# Patient Record
Sex: Male | Born: 1985 | Race: White | Hispanic: No | Marital: Single | Smoking: Never smoker
Health system: Southern US, Community
[De-identification: ages and names within clinical notes are randomized; demographics above are authoritative.]

## PROBLEM LIST (undated history)

## (undated) DIAGNOSIS — F32A Depression, unspecified: Secondary | ICD-10-CM

## (undated) DIAGNOSIS — Z789 Other specified health status: Secondary | ICD-10-CM

## (undated) DIAGNOSIS — F419 Anxiety disorder, unspecified: Secondary | ICD-10-CM

## (undated) DIAGNOSIS — T7840XA Allergy, unspecified, initial encounter: Secondary | ICD-10-CM

## (undated) DIAGNOSIS — F640 Transsexualism: Secondary | ICD-10-CM

## (undated) DIAGNOSIS — F329 Major depressive disorder, single episode, unspecified: Secondary | ICD-10-CM

## (undated) HISTORY — DX: Anxiety disorder, unspecified: F41.9

## (undated) HISTORY — DX: Other specified health status: Z78.9

## (undated) HISTORY — DX: Allergy, unspecified, initial encounter: T78.40XA

## (undated) HISTORY — DX: Major depressive disorder, single episode, unspecified: F32.9

## (undated) HISTORY — DX: Transsexualism: F64.0

## (undated) HISTORY — DX: Depression, unspecified: F32.A

---

## 2006-06-01 HISTORY — PX: MASTECTOMY: SHX3

## 2011-06-02 HISTORY — PX: LAPAROSCOPIC TOTAL HYSTERECTOMY: SUR800

## 2011-06-02 HISTORY — PX: APPENDECTOMY: SHX54

## 2012-09-22 ENCOUNTER — Ambulatory Visit (INDEPENDENT_AMBULATORY_CARE_PROVIDER_SITE_OTHER): Payer: BC Managed Care – PPO | Admitting: Licensed Clinical Social Worker

## 2012-09-22 DIAGNOSIS — F331 Major depressive disorder, recurrent, moderate: Secondary | ICD-10-CM

## 2012-10-06 ENCOUNTER — Ambulatory Visit (INDEPENDENT_AMBULATORY_CARE_PROVIDER_SITE_OTHER): Payer: BC Managed Care – PPO | Admitting: Licensed Clinical Social Worker

## 2012-10-06 DIAGNOSIS — F331 Major depressive disorder, recurrent, moderate: Secondary | ICD-10-CM

## 2012-10-20 ENCOUNTER — Ambulatory Visit (INDEPENDENT_AMBULATORY_CARE_PROVIDER_SITE_OTHER): Payer: BC Managed Care – PPO | Admitting: Licensed Clinical Social Worker

## 2012-10-20 DIAGNOSIS — F331 Major depressive disorder, recurrent, moderate: Secondary | ICD-10-CM

## 2012-11-03 ENCOUNTER — Ambulatory Visit (INDEPENDENT_AMBULATORY_CARE_PROVIDER_SITE_OTHER): Payer: BC Managed Care – PPO | Admitting: Licensed Clinical Social Worker

## 2012-11-03 DIAGNOSIS — F331 Major depressive disorder, recurrent, moderate: Secondary | ICD-10-CM

## 2012-11-17 ENCOUNTER — Ambulatory Visit (INDEPENDENT_AMBULATORY_CARE_PROVIDER_SITE_OTHER): Payer: BC Managed Care – PPO | Admitting: Licensed Clinical Social Worker

## 2012-11-17 DIAGNOSIS — F331 Major depressive disorder, recurrent, moderate: Secondary | ICD-10-CM

## 2012-11-29 ENCOUNTER — Ambulatory Visit (INDEPENDENT_AMBULATORY_CARE_PROVIDER_SITE_OTHER): Payer: BC Managed Care – PPO | Admitting: Licensed Clinical Social Worker

## 2012-11-29 DIAGNOSIS — F331 Major depressive disorder, recurrent, moderate: Secondary | ICD-10-CM

## 2012-12-13 ENCOUNTER — Ambulatory Visit (INDEPENDENT_AMBULATORY_CARE_PROVIDER_SITE_OTHER): Payer: BC Managed Care – PPO | Admitting: Licensed Clinical Social Worker

## 2012-12-13 DIAGNOSIS — F331 Major depressive disorder, recurrent, moderate: Secondary | ICD-10-CM

## 2013-01-10 ENCOUNTER — Ambulatory Visit (INDEPENDENT_AMBULATORY_CARE_PROVIDER_SITE_OTHER): Payer: BC Managed Care – PPO | Admitting: Licensed Clinical Social Worker

## 2013-01-10 DIAGNOSIS — F331 Major depressive disorder, recurrent, moderate: Secondary | ICD-10-CM

## 2013-01-31 ENCOUNTER — Ambulatory Visit (INDEPENDENT_AMBULATORY_CARE_PROVIDER_SITE_OTHER): Payer: BC Managed Care – PPO | Admitting: Licensed Clinical Social Worker

## 2013-01-31 DIAGNOSIS — F331 Major depressive disorder, recurrent, moderate: Secondary | ICD-10-CM

## 2013-02-21 ENCOUNTER — Ambulatory Visit (INDEPENDENT_AMBULATORY_CARE_PROVIDER_SITE_OTHER): Payer: BC Managed Care – PPO | Admitting: Licensed Clinical Social Worker

## 2013-02-21 DIAGNOSIS — F331 Major depressive disorder, recurrent, moderate: Secondary | ICD-10-CM

## 2013-03-14 ENCOUNTER — Ambulatory Visit (INDEPENDENT_AMBULATORY_CARE_PROVIDER_SITE_OTHER): Payer: BC Managed Care – PPO | Admitting: Licensed Clinical Social Worker

## 2013-03-14 DIAGNOSIS — F331 Major depressive disorder, recurrent, moderate: Secondary | ICD-10-CM

## 2013-04-11 ENCOUNTER — Ambulatory Visit (INDEPENDENT_AMBULATORY_CARE_PROVIDER_SITE_OTHER): Payer: BC Managed Care – PPO | Admitting: Licensed Clinical Social Worker

## 2013-04-11 DIAGNOSIS — F331 Major depressive disorder, recurrent, moderate: Secondary | ICD-10-CM

## 2013-04-25 ENCOUNTER — Ambulatory Visit (INDEPENDENT_AMBULATORY_CARE_PROVIDER_SITE_OTHER): Payer: BC Managed Care – PPO | Admitting: Licensed Clinical Social Worker

## 2013-04-25 DIAGNOSIS — F331 Major depressive disorder, recurrent, moderate: Secondary | ICD-10-CM

## 2013-05-11 ENCOUNTER — Ambulatory Visit (INDEPENDENT_AMBULATORY_CARE_PROVIDER_SITE_OTHER): Payer: BC Managed Care – PPO | Admitting: Licensed Clinical Social Worker

## 2013-05-11 DIAGNOSIS — F331 Major depressive disorder, recurrent, moderate: Secondary | ICD-10-CM

## 2013-06-06 ENCOUNTER — Ambulatory Visit (INDEPENDENT_AMBULATORY_CARE_PROVIDER_SITE_OTHER): Payer: BC Managed Care – PPO | Admitting: Licensed Clinical Social Worker

## 2013-06-06 DIAGNOSIS — F331 Major depressive disorder, recurrent, moderate: Secondary | ICD-10-CM

## 2013-06-27 ENCOUNTER — Ambulatory Visit (INDEPENDENT_AMBULATORY_CARE_PROVIDER_SITE_OTHER): Payer: BC Managed Care – PPO | Admitting: Licensed Clinical Social Worker

## 2013-06-27 DIAGNOSIS — F331 Major depressive disorder, recurrent, moderate: Secondary | ICD-10-CM

## 2013-07-25 ENCOUNTER — Ambulatory Visit: Payer: BC Managed Care – PPO | Admitting: Licensed Clinical Social Worker

## 2013-08-08 ENCOUNTER — Ambulatory Visit (INDEPENDENT_AMBULATORY_CARE_PROVIDER_SITE_OTHER): Payer: BC Managed Care – PPO | Admitting: Licensed Clinical Social Worker

## 2013-08-08 DIAGNOSIS — F331 Major depressive disorder, recurrent, moderate: Secondary | ICD-10-CM

## 2013-09-19 ENCOUNTER — Ambulatory Visit (INDEPENDENT_AMBULATORY_CARE_PROVIDER_SITE_OTHER): Payer: BC Managed Care – PPO | Admitting: Licensed Clinical Social Worker

## 2013-09-19 DIAGNOSIS — IMO0002 Reserved for concepts with insufficient information to code with codable children: Secondary | ICD-10-CM

## 2013-10-17 ENCOUNTER — Ambulatory Visit (INDEPENDENT_AMBULATORY_CARE_PROVIDER_SITE_OTHER): Payer: BC Managed Care – PPO | Admitting: Licensed Clinical Social Worker

## 2013-10-17 DIAGNOSIS — F331 Major depressive disorder, recurrent, moderate: Secondary | ICD-10-CM

## 2013-11-16 ENCOUNTER — Ambulatory Visit (INDEPENDENT_AMBULATORY_CARE_PROVIDER_SITE_OTHER): Payer: BC Managed Care – PPO | Admitting: Licensed Clinical Social Worker

## 2013-11-16 DIAGNOSIS — F331 Major depressive disorder, recurrent, moderate: Secondary | ICD-10-CM

## 2013-12-19 ENCOUNTER — Ambulatory Visit (INDEPENDENT_AMBULATORY_CARE_PROVIDER_SITE_OTHER): Payer: BC Managed Care – PPO | Admitting: Licensed Clinical Social Worker

## 2013-12-19 DIAGNOSIS — F331 Major depressive disorder, recurrent, moderate: Secondary | ICD-10-CM

## 2014-01-30 ENCOUNTER — Ambulatory Visit (INDEPENDENT_AMBULATORY_CARE_PROVIDER_SITE_OTHER): Payer: BC Managed Care – PPO | Admitting: Licensed Clinical Social Worker

## 2014-01-30 DIAGNOSIS — F331 Major depressive disorder, recurrent, moderate: Secondary | ICD-10-CM

## 2014-03-13 ENCOUNTER — Ambulatory Visit (INDEPENDENT_AMBULATORY_CARE_PROVIDER_SITE_OTHER): Payer: BC Managed Care – PPO | Admitting: Licensed Clinical Social Worker

## 2014-03-13 DIAGNOSIS — F332 Major depressive disorder, recurrent severe without psychotic features: Secondary | ICD-10-CM

## 2014-04-16 ENCOUNTER — Ambulatory Visit (INDEPENDENT_AMBULATORY_CARE_PROVIDER_SITE_OTHER): Payer: BC Managed Care – PPO | Admitting: Licensed Clinical Social Worker

## 2014-04-16 DIAGNOSIS — F332 Major depressive disorder, recurrent severe without psychotic features: Secondary | ICD-10-CM

## 2014-05-21 ENCOUNTER — Ambulatory Visit (INDEPENDENT_AMBULATORY_CARE_PROVIDER_SITE_OTHER): Payer: BC Managed Care – PPO | Admitting: Licensed Clinical Social Worker

## 2014-05-21 DIAGNOSIS — F332 Major depressive disorder, recurrent severe without psychotic features: Secondary | ICD-10-CM

## 2014-07-09 ENCOUNTER — Ambulatory Visit (INDEPENDENT_AMBULATORY_CARE_PROVIDER_SITE_OTHER): Payer: BLUE CROSS/BLUE SHIELD | Admitting: Licensed Clinical Social Worker

## 2014-07-09 DIAGNOSIS — F332 Major depressive disorder, recurrent severe without psychotic features: Secondary | ICD-10-CM

## 2014-08-06 ENCOUNTER — Ambulatory Visit (INDEPENDENT_AMBULATORY_CARE_PROVIDER_SITE_OTHER): Payer: BLUE CROSS/BLUE SHIELD | Admitting: Licensed Clinical Social Worker

## 2014-08-06 DIAGNOSIS — F332 Major depressive disorder, recurrent severe without psychotic features: Secondary | ICD-10-CM

## 2014-09-10 ENCOUNTER — Ambulatory Visit (INDEPENDENT_AMBULATORY_CARE_PROVIDER_SITE_OTHER): Payer: BLUE CROSS/BLUE SHIELD | Admitting: Licensed Clinical Social Worker

## 2014-09-10 DIAGNOSIS — F332 Major depressive disorder, recurrent severe without psychotic features: Secondary | ICD-10-CM | POA: Diagnosis not present

## 2014-10-08 ENCOUNTER — Ambulatory Visit (INDEPENDENT_AMBULATORY_CARE_PROVIDER_SITE_OTHER): Payer: BLUE CROSS/BLUE SHIELD | Admitting: Licensed Clinical Social Worker

## 2014-10-08 DIAGNOSIS — F332 Major depressive disorder, recurrent severe without psychotic features: Secondary | ICD-10-CM

## 2014-11-05 ENCOUNTER — Ambulatory Visit (INDEPENDENT_AMBULATORY_CARE_PROVIDER_SITE_OTHER): Payer: BLUE CROSS/BLUE SHIELD | Admitting: Licensed Clinical Social Worker

## 2014-11-05 DIAGNOSIS — F332 Major depressive disorder, recurrent severe without psychotic features: Secondary | ICD-10-CM

## 2014-12-05 ENCOUNTER — Ambulatory Visit (INDEPENDENT_AMBULATORY_CARE_PROVIDER_SITE_OTHER): Payer: BLUE CROSS/BLUE SHIELD | Admitting: Licensed Clinical Social Worker

## 2014-12-05 DIAGNOSIS — F332 Major depressive disorder, recurrent severe without psychotic features: Secondary | ICD-10-CM

## 2014-12-31 ENCOUNTER — Ambulatory Visit (INDEPENDENT_AMBULATORY_CARE_PROVIDER_SITE_OTHER): Payer: BLUE CROSS/BLUE SHIELD | Admitting: Licensed Clinical Social Worker

## 2014-12-31 DIAGNOSIS — F332 Major depressive disorder, recurrent severe without psychotic features: Secondary | ICD-10-CM | POA: Diagnosis not present

## 2015-01-28 ENCOUNTER — Ambulatory Visit (INDEPENDENT_AMBULATORY_CARE_PROVIDER_SITE_OTHER): Payer: BLUE CROSS/BLUE SHIELD | Admitting: Licensed Clinical Social Worker

## 2015-01-28 DIAGNOSIS — F332 Major depressive disorder, recurrent severe without psychotic features: Secondary | ICD-10-CM | POA: Diagnosis not present

## 2015-03-04 ENCOUNTER — Ambulatory Visit (INDEPENDENT_AMBULATORY_CARE_PROVIDER_SITE_OTHER): Payer: BLUE CROSS/BLUE SHIELD | Admitting: Licensed Clinical Social Worker

## 2015-03-04 DIAGNOSIS — F332 Major depressive disorder, recurrent severe without psychotic features: Secondary | ICD-10-CM

## 2015-04-01 ENCOUNTER — Ambulatory Visit (INDEPENDENT_AMBULATORY_CARE_PROVIDER_SITE_OTHER): Payer: BLUE CROSS/BLUE SHIELD | Admitting: Licensed Clinical Social Worker

## 2015-04-01 DIAGNOSIS — F332 Major depressive disorder, recurrent severe without psychotic features: Secondary | ICD-10-CM

## 2015-04-29 ENCOUNTER — Ambulatory Visit (INDEPENDENT_AMBULATORY_CARE_PROVIDER_SITE_OTHER): Payer: BLUE CROSS/BLUE SHIELD | Admitting: Licensed Clinical Social Worker

## 2015-04-29 DIAGNOSIS — F332 Major depressive disorder, recurrent severe without psychotic features: Secondary | ICD-10-CM | POA: Diagnosis not present

## 2015-05-29 ENCOUNTER — Ambulatory Visit (INDEPENDENT_AMBULATORY_CARE_PROVIDER_SITE_OTHER): Payer: BLUE CROSS/BLUE SHIELD | Admitting: Licensed Clinical Social Worker

## 2015-05-29 DIAGNOSIS — F332 Major depressive disorder, recurrent severe without psychotic features: Secondary | ICD-10-CM

## 2015-06-24 ENCOUNTER — Ambulatory Visit (INDEPENDENT_AMBULATORY_CARE_PROVIDER_SITE_OTHER): Payer: BLUE CROSS/BLUE SHIELD | Admitting: Licensed Clinical Social Worker

## 2015-06-24 DIAGNOSIS — F332 Major depressive disorder, recurrent severe without psychotic features: Secondary | ICD-10-CM

## 2015-07-22 ENCOUNTER — Ambulatory Visit: Payer: BLUE CROSS/BLUE SHIELD | Admitting: Licensed Clinical Social Worker

## 2015-07-29 ENCOUNTER — Ambulatory Visit (INDEPENDENT_AMBULATORY_CARE_PROVIDER_SITE_OTHER): Payer: BLUE CROSS/BLUE SHIELD | Admitting: Licensed Clinical Social Worker

## 2015-07-29 DIAGNOSIS — F332 Major depressive disorder, recurrent severe without psychotic features: Secondary | ICD-10-CM

## 2015-08-26 ENCOUNTER — Ambulatory Visit (INDEPENDENT_AMBULATORY_CARE_PROVIDER_SITE_OTHER): Payer: BLUE CROSS/BLUE SHIELD | Admitting: Licensed Clinical Social Worker

## 2015-08-26 DIAGNOSIS — F332 Major depressive disorder, recurrent severe without psychotic features: Secondary | ICD-10-CM | POA: Diagnosis not present

## 2015-09-23 ENCOUNTER — Ambulatory Visit (INDEPENDENT_AMBULATORY_CARE_PROVIDER_SITE_OTHER): Payer: BLUE CROSS/BLUE SHIELD | Admitting: Licensed Clinical Social Worker

## 2015-09-23 DIAGNOSIS — F3341 Major depressive disorder, recurrent, in partial remission: Secondary | ICD-10-CM | POA: Diagnosis not present

## 2015-10-22 ENCOUNTER — Ambulatory Visit (INDEPENDENT_AMBULATORY_CARE_PROVIDER_SITE_OTHER): Payer: BLUE CROSS/BLUE SHIELD | Admitting: Licensed Clinical Social Worker

## 2015-10-22 DIAGNOSIS — F3341 Major depressive disorder, recurrent, in partial remission: Secondary | ICD-10-CM

## 2015-11-18 ENCOUNTER — Ambulatory Visit (INDEPENDENT_AMBULATORY_CARE_PROVIDER_SITE_OTHER): Payer: BLUE CROSS/BLUE SHIELD | Admitting: Licensed Clinical Social Worker

## 2015-11-18 DIAGNOSIS — F3341 Major depressive disorder, recurrent, in partial remission: Secondary | ICD-10-CM

## 2015-12-16 ENCOUNTER — Ambulatory Visit (INDEPENDENT_AMBULATORY_CARE_PROVIDER_SITE_OTHER): Payer: BLUE CROSS/BLUE SHIELD | Admitting: Licensed Clinical Social Worker

## 2015-12-16 DIAGNOSIS — F3341 Major depressive disorder, recurrent, in partial remission: Secondary | ICD-10-CM

## 2015-12-31 HISTORY — PX: PARTIAL HYMENECTOMY: SHX327

## 2016-01-20 ENCOUNTER — Ambulatory Visit (INDEPENDENT_AMBULATORY_CARE_PROVIDER_SITE_OTHER): Payer: BLUE CROSS/BLUE SHIELD | Admitting: Licensed Clinical Social Worker

## 2016-01-20 DIAGNOSIS — F3341 Major depressive disorder, recurrent, in partial remission: Secondary | ICD-10-CM

## 2016-03-02 ENCOUNTER — Ambulatory Visit: Payer: BLUE CROSS/BLUE SHIELD | Admitting: Licensed Clinical Social Worker

## 2016-03-23 ENCOUNTER — Ambulatory Visit: Payer: Self-pay | Admitting: Licensed Clinical Social Worker

## 2016-04-07 ENCOUNTER — Ambulatory Visit (INDEPENDENT_AMBULATORY_CARE_PROVIDER_SITE_OTHER): Payer: BLUE CROSS/BLUE SHIELD | Admitting: Licensed Clinical Social Worker

## 2016-04-07 DIAGNOSIS — F3341 Major depressive disorder, recurrent, in partial remission: Secondary | ICD-10-CM

## 2016-09-29 ENCOUNTER — Ambulatory Visit: Payer: Self-pay | Admitting: Family Medicine

## 2016-10-06 ENCOUNTER — Ambulatory Visit (INDEPENDENT_AMBULATORY_CARE_PROVIDER_SITE_OTHER): Payer: BLUE CROSS/BLUE SHIELD | Admitting: Family Medicine

## 2016-10-06 ENCOUNTER — Encounter: Payer: Self-pay | Admitting: Family Medicine

## 2016-10-06 VITALS — BP 126/78 | HR 65 | Temp 98.0°F | Resp 20 | Ht 66.0 in | Wt 150.5 lb

## 2016-10-06 DIAGNOSIS — G4700 Insomnia, unspecified: Secondary | ICD-10-CM

## 2016-10-06 DIAGNOSIS — F640 Transsexualism: Secondary | ICD-10-CM

## 2016-10-06 DIAGNOSIS — Z789 Other specified health status: Secondary | ICD-10-CM | POA: Insufficient documentation

## 2016-10-06 DIAGNOSIS — B360 Pityriasis versicolor: Secondary | ICD-10-CM

## 2016-10-06 DIAGNOSIS — Z7689 Persons encountering health services in other specified circumstances: Secondary | ICD-10-CM

## 2016-10-06 MED ORDER — TRAZODONE HCL 50 MG PO TABS: ORAL_TABLET | ORAL | 0 refills | Status: DC

## 2016-10-06 MED ORDER — CLOTRIMAZOLE-BETAMETHASONE 1-0.05 % EX CREA: 1.0000 "application " | TOPICAL_CREAM | Freq: Two times a day (BID) | CUTANEOUS | 0 refills | Status: DC

## 2016-10-06 NOTE — Patient Instructions (Signed)
Take 1/2 tab (25 mg) about 1 hour before bed. NO SCREENS. After 5 days if still needing additional can taper every 5 days until 1.5 tabs (75 mg).  Follow up in 30 days, before needing refills.   It was a pleasure meeting you!  Please help us help you:  We are honored you have chosen [facility] your [facility]. Below you will find basic instructions that you may need to access in the future. Please help us help you by reading the instructions, which cover many of the frequent questions we experience.   Prescription refills and request:  -In order to allow more efficient response time, please call your pharmacy for all refills. They will forward the request electronically to us. This allows for the quickest possible response. Request left on a nurse line can take longer to refill, since these are checked as time allows between office patients and other phone calls.  - refill request can take up to 3-5 working days to complete.  - If request is sent electronically and request is appropiate, it is usually completed in 1-2 business days.  - all patients will need to be seen routinely for all chronic medical conditions requiring prescription medications (see follow-up below). If you are overdue for follow up on your condition, you will be asked to make an appointment and we will call in enough medication to cover you until your appointment (up to 30 days).  - all controlled substances will require a face to face visit to request/refill.  - if you desire your prescriptions to go through a new pharmacy, and have an active script at original pharmacy, you will need to call your pharmacy and have scripts transferred to new pharmacy. This is completed between the pharmacy locations and not by your provider.    Results: If any images or labs were ordered, it can take up to 1 week to get results depending on the test ordered and the lab/facility running and resulting the test. - Normal or  stable results, which do not need further discussion, may be released to your mychart immediately with attached note to you. A call may not be generated for normal results. Please make certain to sign up for mychart. If you have questions on how to activate your mychart you can call the front office.  - If your results need further discussion, our office will attempt to contact you via phone, and if unable to reach you after 2 attempts, we will release your abnormal result to your mychart with instructions.  - All results will be automatically released in mychart after 1 week.  - Your provider will provide you with explanation and instruction on all relevant material in your results. Please keep in mind, results and labs may appear confusing or abnormal to the untrained eye, but it does not mean they are actually abnormal for you personally. If you have any questions about your results that are not covered, or you desire more detailed explanation than what was provided, you should make an appointment with your provider to do so.   Our office handles many outgoing and incoming calls daily. If we have not contacted you within 1 week about your results, please check your mychart to see if there is a message first and if not, then contact our office.  In helping with this matter, you help decrease call volume, and therefore allow us to be able to respond to patients needs more efficiently.  Acute office visits (sick visit):  An acute visit is intended for a new problem and are scheduled in shorter time slots to allow schedule openings for patients with new problems. This is the appropriate visit to discuss a new problem. In order to provide you with excellent quality medical care with proper time for you to explain your problem, have an exam and receive treatment with instructions, these appointments should be limited to one new problem per visit. If you experience a new problem, in which you desire to be  addressed, please make an acute office visit, we save openings on the schedule to accommodate you. Please do not save your new problem for any other type of visit, let us take care of it properly and quickly for you.   Follow up visits:  Depending on your condition(s) your provider will need to see you routinely in order to provide you with quality care and prescribe medication(s). Most chronic conditions (Example: hypertension, Diabetes, depression/anxiety... etc), require visits a couple times a year. Your provider will instruct you on proper follow up for your personal medical conditions and history. Please make certain to make follow up appointments for your condition as instructed. Failing to do so could result in lapse in your medication treatment/refills. If you request a refill, and are overdue to be seen on a condition, we will always provide you with a 30 day script (once) to allow you time to schedule.    Medicare wellness (well visit): - we have a wonderful Nurse Maudie Mercury), that will meet with you and provide you will yearly medicare wellness visits. These visits should occur yearly (can not be scheduled less than 1 calendar year apart) and cover preventive health, immunizations, advance directives and screenings you are entitled to yearly through your medicare benefits. Do not miss out on your entitled benefits, this is when medicare will pay for these benefits to be ordered for you.  These are strongly encouraged by your provider and is the appropriate type of visit to make certain you are up to date with all preventive health benefits. If you have not had your medicare wellness exam in the last 12 months, please make certain to schedule one by calling the office and schedule your medicare wellness with Maudie Mercury as soon as possible.   Yearly physical (well visit):  - Adults are recommended to be seen yearly for physicals. Check with your insurance and date of your last physical, most insurances  require one calendar year between physicals. Physicals include all preventive health topics, screenings, medical exam and labs that are appropriate for gender/age and history. You may have fasting labs needed at this visit. This is a well visit (not a sick visit), new problems should not be covered during this visit (see acute visit).  - Pediatric patients are seen more frequently when they are younger. Your provider will advise you on well child visit timing that is appropriate for your their age. - This is not a medicare wellness visit. Medicare wellness exams do not have an exam portion to the visit. Some medicare companies allow for a physical, some do not allow a yearly physical. If your medicare allows a yearly physical you can schedule the medicare wellness with our nurse Maudie Mercury and have your physical with your provider after, on the same day. Please check with insurance for your full benefits.   Late Policy/No Shows:  - all new patients should arrive 15-30 minutes earlier than appointment to allow [place] time  to  obtain all personal demographics,  insurance information and for you to complete office paperwork. - All established patients should arrive 10-15 minutes earlier than appointment time to update all information and be checked in .  - In our best efforts to run on time, if you are late for your appointment you will be asked to either reschedule or if able, we will work you back into the schedule. There will be a wait time to work you back in the schedule,  depending on availability.  - If you are unable to make it to your appointment as scheduled, please call 24 hours ahead of time to allow [place] to fill the time slot with someone else who needs to be seen. If you do not cancel your appointment ahead of time, you may be charged a no show fee.

## 2016-10-06 NOTE — Progress Notes (Signed)
Patient ID: Bobby Braslexander Vannest, male  DOB: Mar 06, 1986, 31 y.o.   MRN: [id number] Patient Care Team    Relationship Specialty Notifications Start End  Natalia LeatherwoodKuneff, Renee A, DO PCP - General Family Medicine  10/06/16   Trenton GammonHolt, James A, MD Referring Physician Endocrinology  10/06/16    Comment: hormone therapy.     Chief Complaint  Patient presents with  . Establish Care  . Insomnia  . Rash    Subjective:  Bobby Gill is a 31 y.o.  male present for new patient establishment. All past medical history, surgical history, allergies, family history, immunizations, medications and social history were obtained and updated in the electronic medical record today. All recent labs, ED visits and hospitalizations within the last year were reviewed. Patient is transferring here after moving back from [place].  Male to male transgender: Patient has undergone hormone therapy, currently on testosterone cypionate 150 mg every 21 days. He is established with Dr. Leonor LivHolt in [place] for [place]. He has undergone total laparoscopic hysterectomy with bilateral salpingectomy oophorectomy, double mastectomy.   Insomnia: Patient reports pain on multiple medications for depression and anxiety since he was a teenager. He denies any history of bipolar disorder or other mood disorder. He has weaned himself off of the medications he had used prior which were lithium, Xanax, Abilify, Wellbutrin and nefazodone. He states he goes to bed approximately 9:30 in the evening, but does not fall sleep until about 11:30 PM. He does admit he uses the computer usually during this time. He endorses sometimes multiple awakenings throughout the night, but usually will wake up around 4:30-5 AM. He tries to go back to sleep until his alarm goes off at 8 AM. He denies restless leg syndrome. He endorses mild snoring only sometimes. He denies daytime somnolence. He is currently in the middle of fluctuating hormones, secondary to  testosterone injections for male-male transgender. He had gone without his testosterone for almost 1 month, during this time is when the onset of insomnia and also some weight gain occurred.  Rash: Patient reports a rash multiple hypopigmented spots abdomen, mild itchiness associated. Has been present for a few months. Has never had a rash similar in the past.   Health maintenance:  Colonoscopy: No family history reported, screen at 6850. Mammogram: No family history reported, elective double mastectomy secondary to male-male transgender. Cervical cancer screening: Elective Total hysterectomy. Immunizations: tdap 2008, Influenza  (encouraged yearly) Infectious disease screening: Uncertain if screenings had been completed prior, will await records. DEXA: Elective oophorectomy. Consider monitoring calcium/vitamin D and consider early screening.  Depression screen PHQ 2/9 10/06/2016  Decreased Interest 1  Down, Depressed, Hopeless 0  PHQ - 2 Score 1   No flowsheet data found.  Current Exercise Habits: The patient does not participate in regular exercise at present;The patient has a physically strenous job, but has no regular exercise apart from work. Exercise limited by: None identified   There is no immunization history on file for this patient.  No exam data present  Past Medical History:  Diagnosis Date  . Anxiety   . Depression   . 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    No Known Allergies Past Surgical History:  Procedure Laterality Date  . APPENDECTOMY  2013  . LAPAROSCOPIC TOTAL HYSTERECTOMY  2013   BSO, TLH- nonmalignant reasons.   Marland Kitchen. MASTECTOMY Bilateral 2008   Nonmalignant reasons.   Marland Kitchen. PARTIAL HYMENECTOMY  12/2015   Family History  Problem Relation Age of Onset  . Diabetes Mother   . Mental illness Father   . Mental illness Brother    Social History   Social History  . Marital status: Single    Spouse name: N/A    . Number of children: 0  . Years of education: 14   Occupational History  . pet groomer    Social History Main Topics  . Smoking status: Never Smoker  . Smokeless tobacco: Never Used  . Alcohol use 9.0 oz/week    15 Glasses of wine per week  . Drug use: No  . Sexual activity: No   Other Topics Concern  . Not on file   Social History Narrative   Single. Identifies as a male (male to male transgender).   Some college education. Works as a Research scientist (medical).   Drinks caffeine.   Wears seat belt, bicycle helmet, smoke detector in the home. Firearms are locked in the home.   Feels safe in current relationships   Allergies as of 10/06/2016   No Known Allergies     Medication List       Accurate as of 10/06/16  7:24 PM. Always use your most recent med list.          clotrimazole-betamethasone cream Commonly known as:  LOTRISONE Apply 1 application topically 2 (two) times daily.   testosterone cypionate 200 MG/ML injection Commonly known as:  DEPOTESTOSTERONE CYPIONATE Inject 150 mg into the muscle every 21 ( twenty-one) days.   traZODone 50 MG tablet Commonly known as:  DESYREL 1/2 tab before bed, increase to 1 tab QHS in 5 days.       All past medical history, surgical history, allergies, family history, immunizations andmedications were updated in the EMR today and reviewed under the history and medication portions of their EMR.    No results found for this or any previous visit (from the past 2160 hour(s)).  Patient was never admitted.   ROS: 14 pt review of systems performed and negative (unless mentioned in an HPI)  Objective: BP 126/78 (BP Location: Right Arm, Patient Position: Sitting, Cuff Size: Normal)   Pulse 65   Temp 98 F (36.7 C)   Resp 20   Ht 5\' 6"  (1.676 m)   Wt 150 lb 8 oz (68.3 kg)   SpO2 99%   BMI 24.29 kg/m  Gen: Afebrile. No acute distress. Nontoxic in appearance, well-developed, well-nourished,  Very pleasant Caucasian HENT: AT. [place].  MMM, no oral lesions Eyes:Pupils Equal Round Reactive to light, Extraocular movements intact,  Conjunctiva without redness, discharge or icterus. Neck/lymp/endocrine: Supple, no thyromegaly CV: RRR, no edema, +2/4 P posterior tibialis pulses. Chest: CTAB, no wheeze, rhonchi or crackles.  Abd: Soft. NTND. BS present.  Skin: Multiple round hyperpigmented scaly lesions abdomen. No purpura or petechiae. Warm and well-perfused. Skin intact. Neuro/Msk: Normal gait. PERLA. EOMi. Alert. Oriented x3.   Psych: Normal affect, dress and demeanor. Normal speech. Normal thought content and judgment.  Assessment/plan: Jahvon Gosline is a 31 y.o. male present for establish today.  Male-to-male transgender person - Patient would like to have injections of testosterone completed locally. Our office will call local endocrine office and see if they can provide this resource for him. Patient will remain established in [place] for now.  Insomnia, unspecified type - Discussed different potential therapies. Decided to start trazodone 25 mg daily at bedtime, patient can taper every 5 days by 25 mg, max dose 75 mg daily at bedtime if needed.  Patient is to stop on the dose that allows him to fall asleep, without oversedation in the morning. -Follow-up in 4 weeks  Pityriasis versicolor - clotrimazole-betamethasone (LOTRISONE) cream; Apply 1 application topically 2 (two) times daily.  Dispense: 30 g; Refill: 0   Return in about 4 weeks (around 11/03/2016).  Greater than 45 minutes was spent with patient, greater than 50% of that time was spent face-to-face with patient counseling and/or coordinating care.    Note is dictated utilizing voice recognition software. Although note has been proof read prior to signing, occasional typographical errors still can be missed. If any questions arise, please do not hesitate to call for verification.  Electronically signed by: Felix Pacini, DO [facility]  [place]

## 2016-10-07 ENCOUNTER — Telehealth: Payer: Self-pay | Admitting: Family Medicine

## 2016-10-07 NOTE — Telephone Encounter (Signed)
Can we call Endocrine and ask if they provide hormone therapy (testosterone) to Male to male transgender patient who has been in testosterone for a few years? He would like to establish locally instead of driving [place] [facility].   [phone] [facility]: [phone] [facility]: [phone] [facility]: [phone]  * if they do not, maybe ask if they know of a location that does.

## 2016-10-09 NOTE — Telephone Encounter (Signed)
[facility] End[facility] Dr Everardo AllEllison is the only one that does Hormone therapy. [facility] doesn't have any providers that does Hormone therapy . [facility] does not either but states they think there is [facility] Dr [facility] [place] but I was unable to reach them.

## 2016-10-15 ENCOUNTER — Ambulatory Visit: Payer: BLUE CROSS/BLUE SHIELD | Admitting: Family Medicine

## 2016-10-16 ENCOUNTER — Ambulatory Visit (INDEPENDENT_AMBULATORY_CARE_PROVIDER_SITE_OTHER): Payer: BLUE CROSS/BLUE SHIELD | Admitting: Family Medicine

## 2016-10-16 ENCOUNTER — Encounter: Payer: Self-pay | Admitting: Family Medicine

## 2016-10-16 VITALS — BP 124/74 | HR 73 | Temp 98.1°F | Resp 20 | Wt 151.0 lb

## 2016-10-16 DIAGNOSIS — Z23 Encounter for immunization: Secondary | ICD-10-CM | POA: Diagnosis not present

## 2016-10-16 DIAGNOSIS — L089 Local infection of the skin and subcutaneous tissue, unspecified: Secondary | ICD-10-CM

## 2016-10-16 MED ORDER — AMOXICILLIN-POT CLAVULANATE 875-125 MG PO TABS: 1.0000 | ORAL_TABLET | Freq: Two times a day (BID) | ORAL | 0 refills | Status: DC

## 2016-10-16 NOTE — Addendum Note (Signed)
Addended by: Thomasena EdisWILLIAMS, Takiesha Mcdevitt N on: 10/16/2016 11:01 AM   Modules accepted: Orders

## 2016-10-16 NOTE — Patient Instructions (Signed)
Start Augmentin every 12 hours for for 10 days with food.  Tetanus updated today.  Soak in Epson salt 2 times  A day.  Dr. Everardo AllEllison (endocrine) is the doctor I spoke of for hormones. Let me know what you think.    Warning signs with finger: more redness, swelling or fever. Redness extending into hand.   Stop biting nails! :)

## 2016-10-16 NOTE — Progress Notes (Signed)
Bobby Gill , 04-04-1986, 31 y.o., male MRN: [id number] Patient Care Team    Relationship Specialty Notifications Start End  Natalia Leatherwood, DO PCP - General Family Medicine  10/06/16   Trenton Gammon, MD Referring Physician Endocrinology  10/06/16    Comment: hormone therapy.     Chief Complaint  Patient presents with  . Finger Injury    pinky right hand     Subjective: Pt presents for an OV with complaints of right 5th finger pain of 2 days duration.  Associated symptoms include redness, swelling pain. No known injury. Has been taking NSAIDS without relief. He reports biting fingernails/fingers chronically. Works with dogs. Tetanus due.   Depression screen PHQ 2/9 10/06/2016  Decreased Interest 1  Down, Depressed, Hopeless 0  PHQ - 2 Score 1    No Known Allergies Social History  Substance Use Topics  . Smoking status: Never Smoker  . Smokeless tobacco: Never Used  . Alcohol use 9.0 oz/week    15 Glasses of wine per week   Past Medical History:  Diagnosis Date  . Anxiety   . Depression   . 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    Past Surgical History:  Procedure Laterality Date  . APPENDECTOMY  2013  . LAPAROSCOPIC TOTAL HYSTERECTOMY  2013   BSO, TLH- nonmalignant reasons.   Marland Kitchen MASTECTOMY Bilateral 2008   Nonmalignant reasons.   Marland Kitchen PARTIAL HYMENECTOMY  12/2015   Family History  Problem Relation Age of Onset  . Diabetes Mother   . Mental illness Father   . Mental illness Brother    Allergies as of 10/16/2016   No Known Allergies     Medication List       Accurate as of 10/16/16 10:37 AM. Always use your most recent med list.          clotrimazole-betamethasone cream Commonly known as:  LOTRISONE Apply 1 application topically 2 (two) times daily.   testosterone cypionate 200 MG/ML injection Commonly known as:  DEPOTESTOSTERONE CYPIONATE Inject 150 mg into the muscle every 21 ( twenty-one) days.     traZODone 50 MG tablet Commonly known as:  DESYREL 1/2 tab before bed, increase to 1 tab QHS in 5 days.       All past medical history, surgical history, allergies, family history, immunizations andmedications were updated in the EMR today and reviewed under the history and medication portions of their EMR.     ROS: Negative, with the exception of above mentioned in HPI  Objective:  BP 124/74 (BP Location: Right Arm, Patient Position: Sitting, Cuff Size: Normal)   Pulse 73   Temp 98.1 F (36.7 C)   Resp 20   Wt 151 lb (68.5 kg)   SpO2 98%   BMI 24.37 kg/m  Body mass index is 24.37 kg/m. Gen: Afebrile. No acute distress. Nontoxic in appearance, well developed, well nourished.  MSK: red swollen right 5th distal finger joint. FROM with discomfort. No obvious injury, abrasion. NV intact distally.  No exam data present No results found. No results found for this or any previous visit (from the past 24 hour(s)).  Assessment/Plan: Bobby Gill is a 31 y.o. male present for OV for  Finger infection Distal right 5th finger appears infected today. Possibly secondary to FB or biting nails. 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  Augmeintin prescribed. NSAIDS for discomfort.  epson salt soaks.  Tdap today.  -  F/U if not resolved in 2 weeks, or sooner if worsening.    Reviewed expectations re: course of current medical issues.  Discussed self-management of symptoms.  Outlined signs and symptoms indicating need for more acute intervention.  Patient verbalized understanding and all questions were answered.  Patient received an After-Visit Summary.     Note is dictated utilizing voice recognition software. Although note has been proof read prior to signing, occasional typographical errors still can be missed. If any questions arise, please do not hesitate to call for verification.   electronically signed by:  Felix Pacinienee Kuneff, DO   [facility] [place]

## 2016-10-19 ENCOUNTER — Ambulatory Visit (HOSPITAL_BASED_OUTPATIENT_CLINIC_OR_DEPARTMENT_OTHER)
Admission: RE | Admit: 2016-10-19 | Discharge: 2016-10-19 | Disposition: A | Discharge status: Home / Self Care | Payer: BLUE CROSS/BLUE SHIELD | Source: Ambulatory Visit | Attending: Family Medicine | Admitting: Family Medicine

## 2016-10-19 ENCOUNTER — Ambulatory Visit (INDEPENDENT_AMBULATORY_CARE_PROVIDER_SITE_OTHER): Payer: BLUE CROSS/BLUE SHIELD | Admitting: Family Medicine

## 2016-10-19 ENCOUNTER — Encounter: Payer: Self-pay | Admitting: Family Medicine

## 2016-10-19 VITALS — BP 109/74 | HR 76 | Temp 98.3°F | Resp 16 | Ht 66.0 in | Wt 149.2 lb

## 2016-10-19 DIAGNOSIS — M79644 Pain in right finger(s): Secondary | ICD-10-CM | POA: Insufficient documentation

## 2016-10-19 DIAGNOSIS — M25542 Pain in joints of left hand: Secondary | ICD-10-CM

## 2016-10-19 DIAGNOSIS — L03012 Cellulitis of left finger: Secondary | ICD-10-CM

## 2016-10-19 MED ORDER — PREDNISONE 20 MG PO TABS: ORAL_TABLET | ORAL | 0 refills | Status: DC

## 2016-10-19 NOTE — Patient Instructions (Signed)
Finish you antibiotic. Continue to soak in warm water for 20 min 1-2 times per day. Don't take any ibuprofen or aleve while you are taking prednisone.  If not significantly improving in 3d OR if worsening before that time then arrange f/u in office.

## 2016-10-19 NOTE — Progress Notes (Signed)
OFFICE VISIT  10/19/2016   CC:  Chief Complaint  Patient presents with  . Follow-up    finger - 5th digit right hand   HPI:    Patient is a 31 y.o.  male who presents for 3 day f/u finger infection--R 5th DIP joint.  Seen by Dr. Claiborne BillingsKuneff 3 d/a. Augmentin was rx'd and he has taken it as rx'd. Says finger is unchanged--no better and no worse.  Denies fever/chills/malaise. No other joint pain/redness/swelling.  He has soaked it some and tried ibuprofen x 2 doses--no help. He recalls that prior to the finger turning red, it felt like he needed to pop the DIP jt.  He did this, then rubbed it quite a bit, thinks he heard another pop at some point after.  Then redness/swelling began. No trauma to finger.   Past Medical History:  Diagnosis Date  . Anxiety   . Depression   . 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     Past Surgical History:  Procedure Laterality Date  . APPENDECTOMY  2013  . LAPAROSCOPIC TOTAL HYSTERECTOMY  2013   BSO, TLH- nonmalignant reasons.   Marland Kitchen. MASTECTOMY Bilateral 2008   Nonmalignant reasons.   Marland Kitchen. PARTIAL HYMENECTOMY  12/2015    Outpatient Medications Prior to Visit  Medication Sig Dispense Refill  . amoxicillin-clavulanate (AUGMENTIN) 875-125 MG tablet Take 1 tablet by mouth 2 (two) times daily. 20 tablet 0  . clotrimazole-betamethasone (LOTRISONE) cream Apply 1 application topically 2 (two) times daily. 30 g 0  . testosterone cypionate (DEPOTESTOSTERONE CYPIONATE) 200 MG/ML injection Inject 150 mg into the muscle every 21 ( twenty-one) days.    . traZODone (DESYREL) 50 MG tablet 1/2 tab before bed, increase to 1 tab QHS in 5 days. 30 tablet 0   No facility-administered medications prior to visit.     No Known Allergies  ROS As per HPI  PE: Blood pressure 109/74, pulse 76, temperature 98.3 F (36.8 C), temperature source Oral, resp. rate 16, height 5\' 6"  (1.676 m), weight 149 lb 4 oz (67.7 kg), SpO2 98 %. Gen:  Alert, well appearing.  Patient is oriented to person, place, time, and situation. AFFECT: pleasant, lucid thought and speech. Right 5th digit: mild swelling over DIP joint, with significant blanchable erythema and mild tenderness to palpation.  He can move the digit normal except DIP, which is a bit stiff. No streaking.  Nail appears normal.  The area adjacent to prox nail fold is not erythematous or swollen. No fluctuance to suggest an abscess in soft tissues.   LABS:  none  IMPRESSION AND PLAN:  1) R 5th finger distal erythema: cellulitis vs DIP inflammation.  No sign of soft tissue abscess. Check finger x-ray. Continue augmentin. Start prednisone 40mg  qd x 5d.  Instructions to patient: Doreatha MartinFinish your antibiotic. Continue to soak in warm water for 20 min 1-2 times per day. Don't take any ibuprofen or aleve while you are taking prednisone.  If not significantly improving in 3d OR if worsening before that time then arrange f/u in office.  FOLLOW UP: Return if symptoms worsen or fail to improve.  Signed:  Santiago BumpersPhil Susan Bleich, MD           10/19/2016

## 2016-10-20 ENCOUNTER — Ambulatory Visit: Payer: Self-pay | Admitting: Family Medicine

## 2016-10-27 ENCOUNTER — Telehealth: Payer: Self-pay | Admitting: Family Medicine

## 2016-10-27 DIAGNOSIS — S6990XS Unspecified injury of unspecified wrist, hand and finger(s), sequela: Secondary | ICD-10-CM

## 2016-10-27 NOTE — Telephone Encounter (Signed)
Patient finished pregnisone Sat night 526/18. His pinky started swelling again Monday morning. Please advise.

## 2016-10-27 NOTE — Telephone Encounter (Signed)
Left message with information on patient voice mail per DPR 

## 2016-10-27 NOTE — Telephone Encounter (Signed)
If we can, I would rather get him in to [facility] for urgent referral, Since his xray was essentially normal, he was prescribed and finished both abx and prednisone. They usually have urgent slots available.  If worsening can be seen here, otherwise urgent referral to ortho placed and would like to see him seen in 1-2 days if possible.

## 2016-10-28 NOTE — Telephone Encounter (Signed)
Took first available appointment on 11/16/16. Patient has been placed on a call wait list.

## 2016-10-28 NOTE — Telephone Encounter (Signed)
Hopefully will get seen sooner that 3 weeks, since we asked for an urgent referral. If painful he can take naproxen  (Aleve) every 12 hours. If worsening before ortho appt would want to either see here or get into a different ortho acute clinic Bobby Gill(Bobby Gill).

## 2016-10-29 NOTE — Telephone Encounter (Signed)
Left message with detailed information and instructions on patient voice mail per DPR. 

## 2016-11-03 ENCOUNTER — Ambulatory Visit (INDEPENDENT_AMBULATORY_CARE_PROVIDER_SITE_OTHER): Payer: BLUE CROSS/BLUE SHIELD | Admitting: Family Medicine

## 2016-11-03 ENCOUNTER — Encounter: Payer: Self-pay | Admitting: Family Medicine

## 2016-11-03 VITALS — BP 107/70 | HR 72 | Temp 98.0°F | Resp 20 | Ht 66.0 in | Wt 148.8 lb

## 2016-11-03 DIAGNOSIS — G4700 Insomnia, unspecified: Secondary | ICD-10-CM

## 2016-11-03 DIAGNOSIS — F418 Other specified anxiety disorders: Secondary | ICD-10-CM

## 2016-11-03 MED ORDER — VENLAFAXINE HCL ER 75 MG PO CP24: 75.0000 mg | ORAL_CAPSULE | Freq: Every day | ORAL | 0 refills | Status: DC

## 2016-11-03 MED ORDER — TRAZODONE HCL 50 MG PO TABS: ORAL_TABLET | ORAL | 1 refills | Status: DC

## 2016-11-03 MED ORDER — VENLAFAXINE HCL ER 37.5 MG PO CP24: 37.5000 mg | ORAL_CAPSULE | Freq: Every day | ORAL | 0 refills | Status: DC

## 2016-11-03 NOTE — Progress Notes (Signed)
Patient ID: Bobby Gill, male  DOB: 03-07-1986, 31 y.o.   MRN: [id number] Patient Care Team    Relationship Specialty Notifications Start End  Natalia Leatherwood, DO PCP - General Family Medicine  10/06/16   Trenton Gammon, MD Referring Physician Endocrinology  10/06/16    Comment: hormone therapy.     Chief Complaint  Patient presents with  . Depression    Subjective:  Bobby Gill is a 31 y.o.  male present for insomnia follow up with complaints of increasing anxiety.   Anxiety: Patient states that his anxiety has increased over the last few weeks. He is mainly seen increasing anxiety with new clients at work, which is practically daily. 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 He has started a Anime group in which he is attending conventions about once a month. He states it makes him extremely anxious, but he has pushing self to do it. He admits to depression in the past, feels that he has been more stable with the depression, but does feel it slowly "creeping" back into his life.  Insomnia: Much improved on trazodone 75 mg QD. No notable side effects to medicine. Sleeping through the night without over sedation in the morning.   Prior note:  Patient reports pain on multiple medications for depression and anxiety since he was a teenager. He denies any history of bipolar disorder or other mood disorder. He has weaned himself off of the medications he had used prior which were lithium, Xanax, Abilify, Wellbutrin and nefazodone. He states he goes to bed approximately 9:30 in the evening, but does not fall sleep until about 11:30 PM. He does admit he uses the computer usually during this time. He endorses sometimes multiple awakenings throughout the night, but usually will wake up around 4:30-5 AM. He tries to go back to sleep until his alarm goes off at 8 AM. He denies restless leg syndrome. He endorses mild snoring only sometimes. He denies daytime somnolence. He is currently  in the middle of fluctuating hormones, secondary to testosterone injections for male-male transgender. He had gone without his testosterone for almost 1 month, during this time is when the onset of insomnia and also some weight gain occurred.    Mood disorder screen: borderline, 6-7  #1, yes #2, moderate #3  Depression screen [facility] 2/9 11/03/2016 11/03/2016 10/06/2016  Decreased Interest 1 0 1  Down, Depressed, Hopeless 1 0 0  PHQ - 2 Score 2 0 1  Altered sleeping 0 - -  Tired, decreased energy 1 - -  Change in appetite 2 - -  Feeling bad or failure about yourself  1 - -  Trouble concentrating 0 - -  Moving slowly or fidgety/restless 0 - -  Suicidal thoughts 0 - -  PHQ-9 Score 6 - -   GAD 7 : Generalized Anxiety Score 11/03/2016  Nervous, Anxious, on Edge 2  Control/stop worrying 1  Worry too much - different things 1  Trouble relaxing 1  Restless 1  Easily annoyed or irritable 3  Afraid - awful might happen 2  Total GAD 7 Score 11  Anxiety Difficulty Somewhat difficult        Immunization History  Administered Date(s) Administered  . Tdap 10/16/2016    No exam data present  Past Medical History:  Diagnosis Date  . Anxiety   . Depression   . 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    No Known Allergies  Past Surgical History:  Procedure Laterality Date  . APPENDECTOMY  2013  . LAPAROSCOPIC TOTAL HYSTERECTOMY  2013   BSO, TLH- nonmalignant reasons.   Marland Kitchen MASTECTOMY Bilateral 2008   Nonmalignant reasons.   Marland Kitchen PARTIAL HYMENECTOMY  12/2015   Family History  Problem Relation Age of Onset  . Diabetes Mother   . Mental illness Father   . Mental illness Brother    Social History   Social History  . Marital status: Single    Spouse name: N/A  . Number of children: 0  . Years of education: 14   Occupational History  . pet groomer    Social History Main Topics  . Smoking status: Never Smoker  . Smokeless tobacco: Never Used   . Alcohol use 9.0 oz/week    15 Glasses of wine per week  . Drug use: No  . Sexual activity: No   Other Topics Concern  . Not on file   Social History Narrative   Single. Identifies as a male (male to male transgender).   Some college education. Works as a Research scientist (medical).   Drinks caffeine.   Wears seat belt, bicycle helmet, smoke detector in the home. Firearms are locked in the home.   Feels safe in current relationships   Allergies as of 11/03/2016   No Known Allergies     Medication List       Accurate as of 11/03/16  3:37 PM. Always use your most recent med list.          testosterone cypionate 200 MG/ML injection Commonly known as:  DEPOTESTOSTERONE CYPIONATE Inject 150 mg into the muscle every 21 ( twenty-one) days.   traZODone 50 MG tablet Commonly known as:  DESYREL 1.5 tabs QHS   venlafaxine XR 37.5 MG 24 hr capsule Commonly known as:  EFFEXOR XR Take 1 capsule (37.5 mg total) by mouth daily with breakfast.   venlafaxine XR 75 MG 24 hr capsule Commonly known as:  EFFEXOR-XR Take 1 capsule (75 mg total) by mouth daily with breakfast.       All past medical history, surgical history, allergies, family history, immunizations andmedications were updated in the EMR today and reviewed under the history and medication portions of their EMR.    No results found for this or any previous visit (from the past 2160 hour(s)).  Patient was never admitted.   ROS: 14 pt review of systems performed and negative (unless mentioned in an HPI)  Objective: BP 107/70 (BP Location: Left Arm, Patient Position: Sitting, Cuff Size: Normal)   Pulse 72   Temp 98 F (36.7 C)   Resp 20   Ht 5\' 6"  (1.676 m)   Wt 148 lb 12 oz (67.5 kg)   SpO2 98%   BMI 24.01 kg/m  Gen: Afebrile. No acute distress. Nontoxic in appearance, well-developed, well-nourished,  Very pleasant Caucasian HENT: AT. [place]. MMM, no oral lesions Eyes:Pupils Equal Round Reactive to light, Extraocular movements  intact,  Conjunctiva without redness, discharge or icterus. Neck/lymp/endocrine: Supple, no thyromegaly CV: RRR, no edema, +2/4 P posterior tibialis pulses. Chest: CTAB, no wheeze, rhonchi or crackles.  Abd: Soft. NTND. BS present.  Skin: Multiple round hyperpigmented scaly lesions abdomen. No purpura or petechiae. Warm and well-perfused. Skin intact. Neuro/Msk: Normal gait. PERLA. EOMi. Alert. Oriented x3.   Psych: Normal affect, dress and demeanor. Normal speech. Normal thought content and judgment.  Assessment/plan: Bobby Gill is a 31 y.o. male present for Insomnia, unspecified type - Much improved on  trazodone. Continue trazodone 75 mg daily at bedtime. Refills provided today are 6 months. -Follow-up in 6 months.   Anxiety/depression: - New to this provider, reoccurrence for patient. - Depression screening, anxiety screening and mood disorder screening COMPLETED today with significant results in mild depression, moderate anxiety to general anxiety, and a borderline mood disorder screening. Discussed these with him today, and decided to do a trial of Effexor. Will taper to 75 mg daily. Patient was cautioned not to discontinue medication abruptly. Must taper on an taper off. He reports understanding. - Discussed emergent symptoms, such as increase in depression/suicide ideations or manic symptoms. He verbalized understanding. - Follow-up in 4 weeks.  Return in about 4 weeks (around 12/01/2016).    Note is dictated utilizing voice recognition software. Although note has been proof read prior to signing, occasional typographical errors still can be missed. If any questions arise, please do not hesitate to call for verification.  Electronically signed by: Bobby Pacinienee Kuneff, DO [facility] [place]

## 2016-11-03 NOTE — Patient Instructions (Signed)
Start effexor taper as instructed. Follow up in 4 weeks on new med start. Trazodone called in for you at the 1.5 tabs a night.   I am gad you are sleeping better.

## 2016-12-01 ENCOUNTER — Ambulatory Visit (INDEPENDENT_AMBULATORY_CARE_PROVIDER_SITE_OTHER): Payer: BLUE CROSS/BLUE SHIELD | Admitting: Family Medicine

## 2016-12-01 ENCOUNTER — Encounter: Payer: Self-pay | Admitting: Family Medicine

## 2016-12-01 ENCOUNTER — Ambulatory Visit (INDEPENDENT_AMBULATORY_CARE_PROVIDER_SITE_OTHER): Payer: BLUE CROSS/BLUE SHIELD | Admitting: Licensed Clinical Social Worker

## 2016-12-01 VITALS — BP 107/74 | HR 82 | Temp 98.0°F | Resp 20 | Ht 66.0 in | Wt 151.8 lb

## 2016-12-01 DIAGNOSIS — F640 Transsexualism: Secondary | ICD-10-CM | POA: Diagnosis not present

## 2016-12-01 DIAGNOSIS — Z789 Other specified health status: Secondary | ICD-10-CM

## 2016-12-01 DIAGNOSIS — F418 Other specified anxiety disorders: Secondary | ICD-10-CM | POA: Diagnosis not present

## 2016-12-01 DIAGNOSIS — F3341 Major depressive disorder, recurrent, in partial remission: Secondary | ICD-10-CM

## 2016-12-01 DIAGNOSIS — G4700 Insomnia, unspecified: Secondary | ICD-10-CM | POA: Diagnosis not present

## 2016-12-01 MED ORDER — VENLAFAXINE HCL ER 150 MG PO CP24: 150.0000 mg | ORAL_CAPSULE | Freq: Every day | ORAL | 0 refills | Status: DC

## 2016-12-01 NOTE — Patient Instructions (Signed)
Lets increase your Effexor to 150 mg a day. Follow up in 1 month only if not doing as well as desired, otherwise ok ot followup in 3 months.  Please continue to go to therapy. If you end up wanting a referral to Dr. Gery PrayMendleson (here), just call in and I will be happy to place the referral for you.     Depressive Disorder, Adult Major depressive disorder (MDD) is a mental health condition. MDD often makes you feel sad, hopeless, or helpless. MDD can also cause symptoms in your body. MDD can affect your:  Work.  School.  Relationships.  Other normal activities.  MDD can range from mild to very bad. It may occur once (single episode MDD). It can also occur many times (recurrent MDD). The main symptoms of MDD often include:  Feeling sad, depressed, or irritable most of the time.  Loss of interest.  MDD symptoms also include:  Sleeping too much or too little.  Eating too much or too little.  A change in your weight.  Feeling tired (fatigue) or having low energy.  Feeling worthless.  Feeling guilty.  Trouble making decisions.  Trouble thinking clearly.  Thoughts of suicide or harming others.  Feeling weak.  Feeling agitated.  Keeping yourself from being around other people (isolation).  Follow these instructions at home: Activity  Do these things as told by your doctor: ? Go back to your normal activities. ? Exercise regularly. ? Spend time outdoors. Alcohol  Talk with your doctor about how alcohol can affect your antidepressant medicines.  Do not drink alcohol. Or, limit how much alcohol you drink. ? This means no more than 1 drink a day for nonpregnant women and 2 drinks a day for men. One drink equals one of these:  12 oz of beer.  5 oz of wine.  1 oz of hard liquor. General instructions  Take over-the-counter and prescription medicines only as told by your doctor.  Eat a healthy diet.  Get plenty of sleep.  Find activities that you enjoy.  Make time to do them.  Think about joining a support group. Your doctor may be able to suggest a group for you.  Keep all follow-up visits as told by your doctor. This is important. Where to find more information:  The [employer] on Mental Illness: ? www.nami.org  U[employer] of Mental Health: ? http://www.maynard.net/www.nimh.nih.gov  National Suicide Prevention Lifeline: ? 432-878-35561-262 462 8787. This is free, 24-hour help. Contact a doctor if:  Your symptoms get worse.  You have new symptoms. Get help right away if:  You self-harm.  You see, hear, taste, smell, or feel things that are not present (hallucinate). If you ever feel like you may hurt yourself or others, or have thoughts about taking your own life, get help right away. You can go to your nearest emergency department or call:  Your local emergency services (911 in the [place]  A suicide crisis helpline, such as the National Suicide Prevention Lifeline: ? 984-787-63991-262 462 8787. This is open 24 hours a day.  This information is not intended to replace advice given to you by your health care provider. Make sure you discuss any questions you have with your health care provider. Document Released: 04/29/2015 Document Revised: 02/02/2016 Document Reviewed: 02/02/2016 Elsevier Interactive Patient Education  2017 [employer]

## 2016-12-01 NOTE — Progress Notes (Signed)
Patient ID: Bobby Gill, male  DOB: 08/23/1985, 31 y.o.   MRN: [id number] Patient Care Team    Relationship Specialty Notifications Start End  Natalia LeatherwoodKuneff, Renee A, DO PCP - General Family Medicine  10/06/16   Trenton GammonHolt, James A, MD Referring Physician Endocrinology  10/06/16    Comment: hormone therapy.     Chief Complaint  Patient presents with  . Depression  . Anxiety    Subjective:  Bobby Gill is a 31 y.o.  male present for  Anxiety:  Pt started on Effexor last visit. He is tolerating the medicine well. He feels his anxiety is improved, but he is feeling more down than prior. He denies SI/HI. He continues to try to be more social, and admits he does struggle with this quite a bit. His living environment with is family is a major source of anxiety/depression for him. 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 He has a therapist and an appt today with them.   Prior note:  Patient states that his anxiety has increased over the last few weeks. He is mainly seen increasing anxiety with new clients at work, which is practically daily. 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 He has started a Anime group in which he is attending conventions about once a month. He states it makes him extremely anxious, but he has pushing self to do it. He admits to depression in the past, feels that he has been more stable with the depression, but does feel it slowly "creeping" back into his life.  Insomnia: Much improved on trazodone 75 mg QD. No notable side effects to medicine. Sleeping through the night without over sedation in the morning.   Prior note:  Patient reports pain on multiple medications for depression and anxiety since he was a teenager. He denies any history of bipolar disorder or other mood disorder. He has weaned himself off of the medications he had used prior which were lithium, Xanax, Abilify, Wellbutrin and nefazodone. He states he goes to bed approximately 9:30 in the  evening, but does not fall sleep until about 11:30 PM. He does admit he uses the computer usually during this time. He endorses sometimes multiple awakenings throughout the night, but usually will wake up around 4:30-5 AM. He tries to go back to sleep until his alarm goes off at 8 AM. He denies restless leg syndrome. He endorses mild snoring only sometimes. He denies daytime somnolence. He is currently in the middle of fluctuating hormones, secondary to testosterone injections for male-male transgender. He had gone without his testosterone for almost 1 month, during this time is when the onset of insomnia and also some weight gain occurred.    Mood disorder screen: borderline, 6-7  #1, yes #2, moderate #3 completed 11/03/2016  [facility] 12/01/2016 11/03/2016 11/03/2016 10/06/2016  Decreased Interest 2 1 0 1  Down, Depressed, Hopeless 2 1 0 0  PHQ - 2 Score 4 2 0 1  Altered sleeping 0 0 - -  Tired, decreased energy 2 1 - -  Change in appetite 0 2 - -  Feeling bad or failure about yourself  0 1 - -  Trouble concentrating 0 0 - -  Moving slowly or fidgety/restless 0 0 - -  Suicidal thoughts 0 0 - -  PHQ-9 Score 6 6 - -   GAD 7 : Generalized Anxiety Score 11/03/2016  Nervous, Anxious, on Edge 2  Control/stop worrying 1  Worry  too much - different things 1  Trouble relaxing 1  Restless 1  Easily annoyed or irritable 3  Afraid - awful might happen 2  Total GAD 7 Score 11  Anxiety Difficulty Somewhat difficult        Immunization History  Administered Date(s) Administered  . Tdap 10/16/2016    No exam data present  Past Medical History:  Diagnosis Date  . Anxiety   . Depression   . 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    No Known Allergies Past Surgical History:  Procedure Laterality Date  . APPENDECTOMY  2013  . LAPAROSCOPIC TOTAL HYSTERECTOMY  2013   BSO, TLH- nonmalignant reasons.   Marland Kitchen MASTECTOMY Bilateral 2008    Nonmalignant reasons.   Marland Kitchen PARTIAL HYMENECTOMY  12/2015   Family History  Problem Relation Age of Onset  . Diabetes Mother   . Mental illness Father   . Mental illness Brother    Social History   Social History  . Marital status: Single    Spouse name: N/A  . Number of children: 0  . Years of education: 14   Occupational History  . pet groomer    Social History Main Topics  . Smoking status: Never Smoker  . Smokeless tobacco: Never Used  . Alcohol use 9.0 oz/week    15 Glasses of wine per week  . Drug use: No  . Sexual activity: No   Other Topics Concern  . Not on file   Social History Narrative   Single. Identifies as a male (male to male transgender).   Some college education. Works as a Research scientist (medical).   Drinks caffeine.   Wears seat belt, bicycle helmet, smoke detector in the home. Firearms are locked in the home.   Feels safe in current relationships   Allergies as of 12/01/2016   No Known Allergies     Medication List       Accurate as of 12/01/16  2:20 PM. Always use your most recent med list.          testosterone cypionate 200 MG/ML injection Commonly known as:  DEPOTESTOSTERONE CYPIONATE Inject 150 mg into the muscle every 21 ( twenty-one) days.   traZODone 50 MG tablet Commonly known as:  DESYREL 1.5 tabs QHS   venlafaxine XR 150 MG 24 hr capsule Commonly known as:  EFFEXOR-XR Take 1 capsule (150 mg total) by mouth daily with breakfast.       All past medical history, surgical history, allergies, family history, immunizations andmedications were updated in the EMR today and reviewed under the history and medication portions of their EMR.    No results found for this or any previous visit (from the past 2160 hour(s)).  Patient was never admitted.   ROS: 14 pt review of systems performed and negative (unless mentioned in an HPI)  Objective: BP 107/74 (BP Location: Left Arm, Patient Position: Sitting, Cuff Size: Normal)   Pulse 82   Temp  98 F (36.7 C)   Resp 20   Ht 5\' 6"  (1.676 m)   Wt 151 lb 12 oz (68.8 kg)   SpO2 97%   BMI 24.49 kg/m  Gen: Afebrile. No acute distress.  HENT: AT. [place].  MMM.  Eyes:Pupils Equal Round Reactive to light, Extraocular movements intact,  Conjunctiva without redness, discharge or icterus. CV: RRR  Chest: CTAB, no wheeze or crackles Psych: decreased eye contact today compared to prior. Appears more sad today. Normal dress .  Normal speech. Normal thought content and judgment.    Assessment/plan: Bobby Gill is a 31 y.o. male present for Anxiety/depression/insomnia: - Rpt screen for depression is the same score, different reasons.   - Anxiety improved.  - increase effexor to 150 mg QD. May need to add Abilify (had been on prior).  - CBT with therapist, if desires switch he will call back in and I will place referral to [facility]. - Discussed emergent symptoms, such as increase in depression/suicide ideations or manic symptoms. He verbalized understanding. - Follow-up in 3 months if increased dose is effective, if not then f/u in 4 weeks.  Return in about 3 months (around 03/03/2017).   Note is dictated utilizing voice recognition software. Although note has been proof read prior to signing, occasional typographical errors still can be missed. If any questions arise, please do not hesitate to call for verification.  Electronically signed by: Felix Pacini, DO [facility] [place]

## 2016-12-22 ENCOUNTER — Ambulatory Visit: Payer: BLUE CROSS/BLUE SHIELD | Admitting: Licensed Clinical Social Worker

## 2017-01-04 ENCOUNTER — Ambulatory Visit (INDEPENDENT_AMBULATORY_CARE_PROVIDER_SITE_OTHER): Payer: BLUE CROSS/BLUE SHIELD | Admitting: Family Medicine

## 2017-01-04 ENCOUNTER — Encounter: Payer: Self-pay | Admitting: Family Medicine

## 2017-01-04 VITALS — BP 122/79 | HR 67 | Temp 98.3°F | Resp 18 | Ht 66.0 in | Wt 149.5 lb

## 2017-01-04 DIAGNOSIS — G4700 Insomnia, unspecified: Secondary | ICD-10-CM | POA: Diagnosis not present

## 2017-01-04 DIAGNOSIS — F418 Other specified anxiety disorders: Secondary | ICD-10-CM

## 2017-01-04 DIAGNOSIS — F458 Other somatoform disorders: Secondary | ICD-10-CM | POA: Diagnosis not present

## 2017-01-04 MED ORDER — CLONAZEPAM 0.5 MG PO TABS: 0.5000 mg | ORAL_TABLET | Freq: Two times a day (BID) | ORAL | 1 refills | Status: DC | PRN

## 2017-01-04 MED ORDER — VENLAFAXINE HCL ER 150 MG PO CP24: 150.0000 mg | ORAL_CAPSULE | Freq: Every day | ORAL | 1 refills | Status: DC

## 2017-01-04 NOTE — Progress Notes (Signed)
Bobby Gill , 1986-03-24, 31 y.o., male MRN: [id number] Patient Care Team    Relationship Specialty Notifications Start End  Natalia LeatherwoodKuneff, Hipolito Martinezlopez A, DO PCP - General Family Medicine  10/06/16   Trenton GammonHolt, James A, MD Referring Physician Endocrinology  10/06/16    Comment: hormone therapy.     Chief Complaint  Patient presents with  . Depression  . Anxiety     Subjective: Pt presents for an OV For follow-up on depression and anxiety.  Anxiety/depression/insomnia:  Patient returns today again for his depression and anxiety. He states his depression is much better on current regimen of trazodone 75 mg daily at bedtime, Effexor 150 mg daily. He has been working really hard to become more socially active. He is still currently living with his parents which is creating some increased anxiety. He has found that he is clenching his teeth, that is occurring mainly during the day only. He feels his anxiety is worsening. During his attempts to be more social at conventions, he noticed he has started to experience more panic and anxiety.  Prior note 12/01/2016: Pt started on Effexor last visit. He is tolerating the medicine well. He feels his anxiety is improved, but he is feeling more down than prior. He denies SI/HI. He continues to try to be more social, and admits he does struggle with this quite a bit. His living environment with is family is a major source of anxiety/depression for him. 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 He has a therapist and an appt today with them.   Prior note:  Patient states that his anxiety has increased over the last few weeks. He is mainly seen increasing anxiety with new clients at work, which is practically daily. 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 He has started a Anime group in which he is attending conventions about once a month. He states it makes him extremely anxious, but he has pushing self to do it. He admits to depression in the past, feels  that he has been more stable with the depression, but does feel it slowly "creeping" back into his life.   Mood disorder screen: borderline, 6-7  #1, yes #2, moderate #3 completed 11/03/2016  Dep[facility] screen [facility] 2/9 01/04/2017 12/01/2016 11/03/2016 11/03/2016 10/06/2016  Decreased Interest 0 2 1 0 1  Down, Depressed, Hopeless 0 2 1 0 0  PHQ - 2 Score 0 4 2 0 1  Altered sleeping 0 0 0 - -  Tired, decreased energy 1 2 1  - -  Change in appetite 0 0 2 - -  Feeling bad or failure about yourself  0 0 1 - -  Trouble concentrating 0 0 0 - -  Moving slowly or fidgety/restless 0 0 0 - -  Suicidal thoughts 0 0 0 - -  PHQ-9 Score 1 6 6  - -   GAD 7 : Generalized Anxiety Score 01/04/2017 11/03/2016  Nervous, Anxious, on Edge 1 2  Control/stop worrying 0 1  Worry too much - different things 0 1  Trouble relaxing 1 1  Restless 1 1  Easily annoyed or irritable 0 3  Afraid - awful might happen 0 2  Total GAD 7 Score 3 11  Anxiety Difficulty - Somewhat difficult    No Known Allergies Social History  Substance Use Topics  . Smoking status: Never Smoker  . Smokeless tobacco: Never Used  . Alcohol use 9.0 oz/week    15 Glasses of wine per week  Past Medical History:  Diagnosis Date  . Anxiety   . Depression   . 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    Past Surgical History:  Procedure Laterality Date  . APPENDECTOMY  2013  . LAPAROSCOPIC TOTAL HYSTERECTOMY  2013   BSO, TLH- nonmalignant reasons.   Marland Kitchen MASTECTOMY Bilateral 2008   Nonmalignant reasons.   Marland Kitchen PARTIAL HYMENECTOMY  12/2015   Family History  Problem Relation Age of Onset  . Diabetes Mother   . Mental illness Father   . Mental illness Brother    Allergies as of 01/04/2017   No Known Allergies     Medication List       Accurate as of 01/04/17  3:04 PM. Always use your most recent med list.          testosterone cypionate 200 MG/ML injection Commonly known as:  DEPOTESTOSTERONE  CYPIONATE Inject 150 mg into the muscle every 21 ( twenty-one) days.   traZODone 50 MG tablet Commonly known as:  DESYREL 1.5 tabs QHS   venlafaxine XR 150 MG 24 hr capsule Commonly known as:  EFFEXOR-XR Take 1 capsule (150 mg total) by mouth daily with breakfast.       All past medical history, surgical history, allergies, family history, immunizations andmedications were updated in the EMR today and reviewed under the history and medication portions of their EMR.     ROS: Negative, with the exception of above mentioned in HPI   Objective:  BP 122/79 (BP Location: Right Arm, Patient Position: Sitting, Cuff Size: Normal)   Pulse 67   Temp 98.3 F (36.8 C)   Resp 18   Ht 5\' 6"  (1.676 m)   Wt 149 lb 8 oz (67.8 kg)   SpO2 99%   BMI 24.13 kg/m  Body mass index is 24.13 kg/m.  Gen: Afebrile. No acute distress. Nontoxic in appearance, well developed, well nourished.  Psych: Mildly anxious, otherwise Normal affect, dress and demeanor. Normal speech. Normal thought content and judgment.  No exam data present No results found. No results found for this or any previous visit (from the past 24 hour(s)).  Assessment/Plan: Bobby Gill is a 31 y.o. male present for OV for  Depression with anxiety Insomnia, unspecified type Teeth grinding - discussion today surrounding patient's depression and anxiety. His teeth grinding is likely secondary to increased anxiety. His depression and anxiety screening told are greatly improved with current regimen. However given he is now having more anxiety and borderline panic during conventions feel the need to increase his therapy today.  - Lengthy discussion surrounding different types of medications, including controlled substances. He has been on Xanax in the past, do not believe this is something he needs in the future. However, he will likely benefit from Klonopin. Agreed to try Klonopin 0.5 mg twice a day scheduled.  - atient was  counseled on controlled substances, potential addictive properties and need for routine follow-up with this provider only for refill. He is in agreement with plan today, and has signed a controlled substance contract. [place] controlled substance database has been reviewed today, and a part of patient's permanent chart. - Follow-up in 4 weeks   Reviewed expectations re: course of current medical issues.  Discussed self-management of symptoms.  Outlined signs and symptoms indicating need for more acute intervention.  Patient verbalized understanding and all questions were answered.  Patient received an After-Visit Summary.    No orders of the defined types were placed  in this encounter.  > 25 minutes spent with patient, >50% of time spent face to face counseling    Note is dictated utilizing voice recognition software. Although note has been proof read prior to signing, occasional typographical errors still can be missed. If any questions arise, please do not hesitate to call for verification.   electronically signed by:  Felix Pacini, DO  [facility] - [place]

## 2017-01-04 NOTE — Patient Instructions (Signed)
Start klonopin every 12 hours, follow up in 4 weeks. If we need to we can increase dose at that time.    Continue effexor.   Stop trazodone while first starting klonopin at night, You can use together, but would want to make sure you do not get too sedated by using both.

## 2017-01-12 ENCOUNTER — Ambulatory Visit: Payer: BLUE CROSS/BLUE SHIELD | Admitting: Licensed Clinical Social Worker

## 2017-01-15 ENCOUNTER — Ambulatory Visit (INDEPENDENT_AMBULATORY_CARE_PROVIDER_SITE_OTHER): Payer: BLUE CROSS/BLUE SHIELD | Admitting: Licensed Clinical Social Worker

## 2017-01-15 DIAGNOSIS — F3341 Major depressive disorder, recurrent, in partial remission: Secondary | ICD-10-CM | POA: Diagnosis not present

## 2017-01-29 ENCOUNTER — Encounter: Payer: Self-pay | Admitting: Family Medicine

## 2017-01-29 ENCOUNTER — Ambulatory Visit (INDEPENDENT_AMBULATORY_CARE_PROVIDER_SITE_OTHER): Payer: BLUE CROSS/BLUE SHIELD | Admitting: Family Medicine

## 2017-01-29 VITALS — BP 112/77 | HR 67 | Temp 98.0°F | Resp 20 | Ht 66.0 in | Wt 150.5 lb

## 2017-01-29 DIAGNOSIS — G4700 Insomnia, unspecified: Secondary | ICD-10-CM | POA: Diagnosis not present

## 2017-01-29 DIAGNOSIS — F418 Other specified anxiety disorders: Secondary | ICD-10-CM | POA: Diagnosis not present

## 2017-01-29 DIAGNOSIS — F458 Other somatoform disorders: Secondary | ICD-10-CM | POA: Diagnosis not present

## 2017-01-29 MED ORDER — CLONAZEPAM 0.5 MG PO TABS: 0.5000 mg | ORAL_TABLET | Freq: Two times a day (BID) | ORAL | 5 refills | Status: DC | PRN

## 2017-01-29 MED ORDER — VENLAFAXINE HCL ER 75 MG PO CP24: 225.0000 mg | ORAL_CAPSULE | Freq: Every day | ORAL | 1 refills | Status: DC

## 2017-01-29 NOTE — Progress Notes (Signed)
Bobby Gill , 04/29/1986, 31 y.o., male MRN: [id number] Patient Care Team    Relationship Specialty Notifications Start End  Natalia LeatherwoodKuneff, Jaythen Hamme A, DO PCP - General Family Medicine  10/06/16   Trenton GammonHolt, James A, MD Referring Physician Endocrinology  10/06/16    Comment: hormone therapy.     Chief Complaint  Patient presents with  . Depression  . Anxiety     Subjective: Pt presents for an OV For follow-up on depression and anxiety.  Anxiety/depression/insomnia/bruxism:  Patient returns today for follow-up on his depression and anxiety. He reports that the addition of the low-dose Klonopin twice a day was extremely helpful. He is still having some anxiety, but feels in better control. He still feels he could benefit from additional dosage. He reports compliance with trazodone 50 mg daily at bedtime, Effexor 150 mg daily and Klonopin 0.5 mg twice a day. He is pleased that he has started to be able to get back into his drawing. He is trying to get out a house in the evenings even if it is going to the park. He reports he still clenching his teeth, but it's very frequent.  Prior note 12/01/2016: Pt started on Effexor last visit. He is tolerating the medicine well. He feels his anxiety is improved, but he is feeling more down than prior. He denies SI/HI. He continues to try to be more social, and admits he does struggle with this quite a bit. His living environment with is family is a major source of anxiety/depression for him. 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 He has a therapist and an appt today with them.   Prior note:  Patient states that his anxiety has increased over the last few weeks. He is mainly seen increasing anxiety with new clients at work, which is practically daily. 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 He has started a Anime group in which he is attending conventions about once a month. He states it makes him extremely anxious, but he has pushing self to do it. He  admits to depression in the past, feels that he has been more stable with the depression, but does feel it slowly "creeping" back into his life.   Mood disorder screen: borderline, 6-7  #1, yes #2, moderate #3 completed 11/03/2016  Dep[facility] screen [facility] 01/04/2017 12/01/2016 11/03/2016 11/03/2016 10/06/2016  Decreased Interest 0 2 1 0 1  Down, Depressed, Hopeless 0 2 1 0 0  PHQ - 2 Score 0 4 2 0 1  Altered sleeping 0 0 0 - -  Tired, decreased energy 1 2 1  - -  Change in appetite 0 0 2 - -  Feeling bad or failure about yourself  0 0 1 - -  Trouble concentrating 0 0 0 - -  Moving slowly or fidgety/restless 0 0 0 - -  Suicidal thoughts 0 0 0 - -  PHQ-9 Score 1 6 6  - -   GAD 7 : Generalized Anxiety Score 01/04/2017 11/03/2016  Nervous, Anxious, on Edge 1 2  Control/stop worrying 0 1  Worry too much - different things 0 1  Trouble relaxing 1 1  Restless 1 1  Easily annoyed or irritable 0 3  Afraid - awful might happen 0 2  Total GAD 7 Score 3 11  Anxiety Difficulty - Somewhat difficult    No Known Allergies Social History  Substance Use Topics  . Smoking status: Never Smoker  . Smokeless tobacco: Never Used  .  Alcohol use 9.0 oz/week    15 Glasses of wine per week   Past Medical History:  Diagnosis Date  . Allergy   . Anxiety   . Depression   . 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    Past Surgical History:  Procedure Laterality Date  . APPENDECTOMY  2013  . LAPAROSCOPIC TOTAL HYSTERECTOMY  2013   BSO, TLH- nonmalignant reasons.   Marland Kitchen MASTECTOMY Bilateral 2008   Nonmalignant reasons.   Marland Kitchen PARTIAL HYMENECTOMY  12/2015   Family History  Problem Relation Age of Onset  . Diabetes Mother   . Mental illness Father   . Mental illness Brother    Allergies as of 01/29/2017   No Known Allergies     Medication List       Accurate as of 01/29/17  4:07 PM. Always use your most recent med list.          clonazePAM 0.5 MG tablet Commonly  known as:  KLONOPIN Take 1 tablet (0.5 mg total) by mouth 2 (two) times daily as needed for anxiety.   fexofenadine 180 MG tablet Commonly known as:  ALLEGRA Take 180 mg by mouth daily.   testosterone cypionate 200 MG/ML injection Commonly known as:  DEPOTESTOSTERONE CYPIONATE Inject 150 mg into the muscle every 21 ( twenty-one) days.   traZODone 50 MG tablet Commonly known as:  DESYREL 1.5 tabs QHS   venlafaxine XR 150 MG 24 hr capsule Commonly known as:  EFFEXOR-XR Take 1 capsule (150 mg total) by mouth daily with breakfast.       All past medical history, surgical history, allergies, family history, immunizations andmedications were updated in the EMR today and reviewed under the history and medication portions of their EMR.     ROS: Negative, with the exception of above mentioned in HPI   Objective:  BP 112/77 (BP Location: Left Arm, Patient Position: Sitting, Cuff Size: Normal)   Pulse 67   Temp 98 F (36.7 C)   Resp 20   Ht 5\' 6"  (1.676 m)   Wt 150 lb 8 oz (68.3 kg)   SpO2 97%   BMI 24.29 kg/m  Body mass index is 24.29 kg/m.  Gen: Afebrile. No acute distress. Nontoxic in appearance, well-developed, well-nourished. HENT: AT. [place].  MMM. Eyes:Pupils Equal Round Reactive to light, Extraocular movements intact,  Conjunctiva without redness, discharge or icterus. CV: RRR, no murmur Chest: CTAB, no wheeze or crackles Psych: Normal affect, dress and demeanor. Normal speech. Normal thought content and judgment.  No exam data present No results found. No results found for this or any previous visit (from the past 24 hour(s)).  Assessment/Plan: Bobby Gill is a 31 y.o. male present for OV for  Depression with anxiety Insomnia, unspecified type Teeth grinding - Continue Klonopin 0.5 mg twice a day, refills provided for 6 months.  - Continue trazodone 75 mg daily. - Increase Effexor to 225 mg daily. - Controlled substance contract signed, [place]  controlled substance database printed and reviewed, made part of her manage her today. - Follow-up in 3 months, sooner if needed.  Reviewed expectations re: course of current medical issues.  Discussed self-management of symptoms.  Outlined signs and symptoms indicating need for more acute intervention.  Patient verbalized understanding and all questions were answered.  Patient received an After-Visit Summary.    No orders of the defined types were placed in this encounter.  > 25 minutes spent with patient, >50% of time  spent face to face counseling    Note is dictated utilizing voice recognition software. Although note has been proof read prior to signing, occasional typographical errors still can be missed. If any questions arise, please do not hesitate to call for verification.   electronically signed by:  Felix Pacini, DO  [facility] - [place]

## 2017-01-29 NOTE — Patient Instructions (Signed)
It was great to see you today.  I am glad you are doing well.   Follow up in 3 months unless need to be seen sooner.    Please help us help you:  [facility] are [facility]. Below you will find basic instructions that you may need to access in the future. Please help us help you by reading the instructions, which cover many of the frequent questions we experience.   Prescription refills and request:  -In order to allow more efficient response time, please call your pharmacy for all refills. They will forward the request electronically to us. This allows for the quickest possible response. Request left on a nurse line can take longer to refill, since these are checked as time allows between office patients and other phone calls.  - refill request can take up to 3-5 working days to complete.  - If request is sent electronically and request is appropiate, it is usually completed in 1-2 business days.  - all patients will need to be seen routinely for all chronic medical conditions requiring prescription medications (see follow-up below). If you are overdue for follow up on your condition, you will be asked to make an appointment and we will call in enough medication to cover you until your appointment (up to 30 days).  - all controlled substances will require a face to face visit to request/refill.  - if you desire your prescriptions to go through a new pharmacy, and have an active script at original pharmacy, you will need to call your pharmacy and have scripts transferred to new pharmacy. This is completed between the pharmacy locations and not by your provider.    Results: If any images or labs were ordered, it can take up to 1 week to get results depending on the test ordered and the lab/facility running and resulting the test. - Normal or stable results, which do not need further discussion, may be released to your mychart immediately with  attached note to you. A call may not be generated for normal results. Please make certain to sign up for mychart. If you have questions on how to activate your mychart you can call the front office.  - If your results need further discussion, our office will attempt to contact you via phone, and if unable to reach you after 2 attempts, we will release your abnormal result to your mychart with instructions.  - All results will be automatically released in mychart after 1 week.  - Your provider will provide you with explanation and instruction on all relevant material in your results. Please keep in mind, results and labs may appear confusing or abnormal to the untrained eye, but it does not mean they are actually abnormal for you personally. If you have any questions about your results that are not covered, or you desire more detailed explanation than what was provided, you should make an appointment with your provider to do so.   Our office handles many outgoing and incoming calls daily. If we have not contacted you within 1 week about your results, please check your mychart to see if there is a message first and if not, then contact our office.  In helping with this matter, you help decrease call volume, and therefore allow us to be able to respond to patients needs more efficiently.   Acute office visits (sick visit):  An acute visit is intended for a new problem and  are scheduled in shorter time slots to allow schedule openings for patients with new problems. This is the appropriate visit to discuss a new problem. In order to provide you with excellent quality medical care with proper time for you to explain your problem, have an exam and receive treatment with instructions, these appointments should be limited to one new problem per visit. If you experience a new problem, in which you desire to be addressed, please make an acute office visit, we save openings on the schedule to accommodate you. Please do  not save your new problem for any other type of visit, let us take care of it properly and quickly for you.   Follow up visits:  Depending on your condition(s) your provider will need to see you routinely in order to provide you with quality care and prescribe medication(s). Most chronic conditions (Example: hypertension, Diabetes, depression/anxiety... etc), require visits a couple times a year. Your provider will instruct you on proper follow up for your personal medical conditions and history. Please make certain to make follow up appointments for your condition as instructed. Failing to do so could result in lapse in your medication treatment/refills. If you request a refill, and are overdue to be seen on a condition, we will always provide you with a 30 day script (once) to allow you time to schedule.    Medicare wellness (well visit): - we have a wonderful Nurse Maudie Mercury), that will meet with you and provide you will yearly medicare wellness visits. These visits should occur yearly (can not be scheduled less than 1 calendar year apart) and cover preventive health, immunizations, advance directives and screenings you are entitled to yearly through your medicare benefits. Do not miss out on your entitled benefits, this is when medicare will pay for these benefits to be ordered for you.  These are strongly encouraged by your provider and is the appropriate type of visit to make certain you are up to date with all preventive health benefits. If you have not had your medicare wellness exam in the last 12 months, please make certain to schedule one by calling the office and schedule your medicare wellness with Maudie Mercury as soon as possible.   Yearly physical (well visit):  - Adults are recommended to be seen yearly for physicals. Check with your insurance and date of your last physical, most insurances require one calendar year between physicals. Physicals include all preventive health topics, screenings, medical  exam and labs that are appropriate for gender/age and history. You may have fasting labs needed at this visit. This is a well visit (not a sick visit), new problems should not be covered during this visit (see acute visit).  - Pediatric patients are seen more frequently when they are younger. Your provider will advise you on well child visit timing that is appropriate for your their age. - This is not a medicare wellness visit. Medicare wellness exams do not have an exam portion to the visit. Some medicare companies allow for a physical, some do not allow a yearly physical. If your medicare allows a yearly physical you can schedule the medicare wellness with our nurse Maudie Mercury and have your physical with your provider after, on the same day. Please check with insurance for your full benefits.   Late Policy/No Shows:  - all new patients should arrive 15-30 minutes earlier than appointment to allow [place] time  to  obtain all personal demographics,  insurance information and for you to complete office paperwork. -  All established patients should arrive 10-15 minutes earlier than appointment time to update all information and be checked in .  - In our best efforts to run on time, if you are late for your appointment you will be asked to either reschedule or if able, we will work you back into the schedule. There will be a wait time to work you back in the schedule,  depending on availability.  - If you are unable to make it to your appointment as scheduled, please call 24 hours ahead of time to allow [place] to fill the time slot with someone else who needs to be seen. If you do not cancel your appointment ahead of time, you may be charged a no show fee.

## 2017-02-02 ENCOUNTER — Ambulatory Visit: Payer: Self-pay | Admitting: Family Medicine

## 2017-02-22 ENCOUNTER — Ambulatory Visit (INDEPENDENT_AMBULATORY_CARE_PROVIDER_SITE_OTHER): Payer: BLUE CROSS/BLUE SHIELD | Admitting: Licensed Clinical Social Worker

## 2017-02-22 DIAGNOSIS — F3341 Major depressive disorder, recurrent, in partial remission: Secondary | ICD-10-CM | POA: Diagnosis not present

## 2017-02-26 ENCOUNTER — Telehealth: Payer: Self-pay | Admitting: Family Medicine

## 2017-02-26 NOTE — Telephone Encounter (Signed)
Patient left VM on front desk requesting his 03/02/17 to be cancelled. He thinks it was taken care of in his last visit but he wanted to make sure.

## 2017-03-02 ENCOUNTER — Ambulatory Visit: Payer: BLUE CROSS/BLUE SHIELD | Admitting: Family Medicine

## 2017-03-22 ENCOUNTER — Ambulatory Visit (INDEPENDENT_AMBULATORY_CARE_PROVIDER_SITE_OTHER): Payer: BLUE CROSS/BLUE SHIELD | Admitting: Licensed Clinical Social Worker

## 2017-03-22 DIAGNOSIS — F3341 Major depressive disorder, recurrent, in partial remission: Secondary | ICD-10-CM

## 2017-05-03 ENCOUNTER — Ambulatory Visit: Payer: BLUE CROSS/BLUE SHIELD | Admitting: Family Medicine

## 2017-05-03 ENCOUNTER — Encounter: Payer: Self-pay | Admitting: Family Medicine

## 2017-05-03 VITALS — BP 110/78 | HR 90 | Temp 98.0°F | Ht 66.0 in | Wt 137.0 lb

## 2017-05-03 DIAGNOSIS — F418 Other specified anxiety disorders: Secondary | ICD-10-CM

## 2017-05-03 DIAGNOSIS — G4700 Insomnia, unspecified: Secondary | ICD-10-CM | POA: Diagnosis not present

## 2017-05-03 MED ORDER — TRAZODONE HCL 50 MG PO TABS: ORAL_TABLET | ORAL | 1 refills | Status: AC

## 2017-05-03 MED ORDER — CLONAZEPAM 0.5 MG PO TABS: 0.5000 mg | ORAL_TABLET | Freq: Two times a day (BID) | ORAL | 5 refills | Status: AC | PRN

## 2017-05-03 MED ORDER — VENLAFAXINE HCL ER 75 MG PO CP24: 225.0000 mg | ORAL_CAPSULE | Freq: Every day | ORAL | 1 refills | Status: AC

## 2017-05-03 NOTE — Patient Instructions (Signed)
I wish you well with your move! I hope you do well.   I refilled your meds for 6 months, this should cover you until you can establish with someone closer to your soon to be new home.     It was a pleasure taking care of you.

## 2017-05-03 NOTE — Progress Notes (Signed)
Bobby Gill , 27-Oct-1985, 31 y.o., male MRN: [id number] Patient Care Team    Relationship Specialty Notifications Start End  Natalia LeatherwoodKuneff, Eames Dibiasio A, DO PCP - General Family Medicine  10/06/16   Trenton GammonHolt, James A, MD Referring Physician Endocrinology  10/06/16    Comment: hormone therapy.     Chief Complaint  Patient presents with  . Depression  . Anxiety     Subjective: Pt presents for an OV For follow-up on depression and anxiety.  Anxiety/depression/insomnia: Patient reports he is doing rather well with his depression and anxiety. He is very happy with the current medication regimen. He is sleeping well. His currently well adjusted in his life. He does have some upcoming stress with a move out of the area. He will need to establish with a new PCP. He will need to find a new job. [place]  Prior note 01/29/2017: Patient returns today for follow-up on his depression and anxiety. He reports that the addition of the low-dose Klonopin twice a day was extremely helpful. He is still having some anxiety, but feels in better control. He still feels he could benefit from additional dosage. He reports compliance with trazodone 50 mg daily at bedtime, Effexor 150 mg daily and Klonopin 0.5 mg twice a day. He is pleased that he has started to be able to get back into his drawing. He is trying to get out a house in the evenings even if it is going to the park. He reports he still clenching his teeth, but it's very frequent.  Prior note 12/01/2016: Pt started on Effexor last visit. He is tolerating the medicine well. He feels his anxiety is improved, but he is feeling more down than prior. He denies SI/HI. He continues to try to be more social, and admits he does struggle with this quite a bit. His living environment with is family is a major source of anxiety/depression for him. 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 He has a therapist and  an appt today with them.   Prior note:  Patient states that his anxiety has increased over the last few weeks. He is mainly seen increasing anxiety with new clients at work, which is practically daily. 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 He has started a Anime group in which he is attending conventions about once a month. He states it makes him extremely anxious, but he has pushing self to do it. He admits to depression in the past, feels that he has been more stable with the depression, but does feel it slowly "creeping" back into his life.   Mood disorder screen: borderline, 6-7  #1, yes #2, moderate #3 completed 11/03/2016  Depression screen [facility] 2/[facility] 05/03/2017 01/04/2017 12/01/2016 11/03/2016 11/03/2016  Decreased Interest 0 0 2 1 0  Down, Depressed, Hopeless 0 0 2 1 0  PHQ - 2 Score 0 0 4 2 0  Altered sleeping 0 0 0 0 -  Tired, decreased energy 1 1 2 1  -  Change in appetite 0 0 0 2 -  Feeling bad or failure about yourself  0 0 0 1 -  Trouble concentrating 0 0 0 0 -  Moving slowly or fidgety/restless 0 0 0 0 -  Suicidal thoughts 0 0 0 0 -  PHQ-9 Score 1 1 6 6  -   GAD 7 : Generalized Anxiety Score 05/03/2017 01/04/2017 11/03/2016  Nervous, Anxious, on Edge  1 1 2   Control/stop worrying 1 0 1  Worry too much - different things 0 0 1  Trouble relaxing 0 1 1  Restless 0 1 1  Easily annoyed or irritable 0 0 3  Afraid - awful might happen 0 0 2  Total GAD 7 Score 2 3 11   Anxiety Difficulty - - Somewhat difficult    No Known Allergies Social History   Tobacco Use  . Smoking status: Never Smoker  . Smokeless tobacco: Never Used  Substance Use Topics  . Alcohol use: Yes    Alcohol/week: 9.0 oz    Types: 15 Glasses of wine per week   Past Medical History:  Diagnosis Date  . Allergy   . Anxiety   . Depression   . 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    Past Surgical History:  Procedure Laterality Date  . APPENDECTOMY  2013  .  LAPAROSCOPIC TOTAL HYSTERECTOMY  2013   BSO, TLH- nonmalignant reasons.   Marland Kitchen. MASTECTOMY Bilateral 2008   Nonmalignant reasons.   Marland Kitchen. PARTIAL HYMENECTOMY  12/2015   Family History  Problem Relation Age of Onset  . Diabetes Mother   . Mental illness Father   . Mental illness Brother    Allergies as of 05/03/2017   No Known Allergies     Medication List        Accurate as of 05/03/17  2:31 PM. Always use your most recent med list.          clonazePAM 0.5 MG tablet Commonly known as:  KLONOPIN Take 1 tablet (0.5 mg total) by mouth 2 (two) times daily as needed for anxiety.   fexofenadine 180 MG tablet Commonly known as:  ALLEGRA Take 180 mg by mouth daily.   testosterone cypionate 200 MG/ML injection Commonly known as:  DEPOTESTOSTERONE CYPIONATE Inject 150 mg into the muscle every 21 ( twenty-one) days.   traZODone 50 MG tablet Commonly known as:  DESYREL 1.5 tabs QHS   venlafaxine XR 75 MG 24 hr capsule Commonly known as:  EFFEXOR-XR Take 3 capsules (225 mg total) by mouth daily with breakfast.       All past medical history, surgical history, allergies, family history, immunizations andmedications were updated in the EMR today and reviewed under the history and medication portions of their EMR.     ROS: Negative, with the exception of above mentioned in HPI   Objective:  BP 110/78 (BP Location: Left Arm, Patient Position: Sitting)   Pulse 90   Temp 98 F (36.7 C)   Ht 5\' 6"  (1.676 m)   Wt 137 lb (62.1 kg)   SpO2 98%   BMI 22.11 kg/m  Body mass index is 22.11 kg/m.  Gen: Afebrile. No acute distress.  HENT: AT. [place].  MMM.  CV: RRR Chest: CTAB, no wheeze or crackles.  Neuro:  Normal gait. PERLA. EOMi. Alert. Oriented x3 Psych: Normal affect, dress and demeanor. Normal speech. Normal thought content and judgment.   No exam data present No results found. No results found for this or any previous visit (from the past 24  hour(s)).  Assessment/Plan: Bobby Gill is a 31 y.o. male present for OV for  Depression with anxiety Insomnia, unspecified type - Continue Klonopin 0.5 mg twice a day, refills provided for 6 months. a controlled substance database printed, reviewed and made part of his permanent record. - Continue trazodone 75 mg daily. - Continue Effexor to 225 mg daily. - F/u  6 months recommended, sooner if needed. He will be moving soon.   Reviewed expectations re: course of current medical issues.  Discussed self-management of symptoms.  Outlined signs and symptoms indicating need for more acute intervention.  Patient verbalized understanding and all questions were answered.  Patient received an After-Visit Summary.    No orders of the defined types were placed in this encounter.  > 25 minutes spent with patient, >50% of time spent face to face counseling    Note is dictated utilizing voice recognition software. Although note has been proof read prior to signing, occasional typographical errors still can be missed. If any questions arise, please do not hesitate to call for verification.   electronically signed by:  Felix Pacini, DO  [facility] - [place]

## 2018-03-29 IMAGING — DX DG FINGER LITTLE 2+V*R*
3 series · 3 of 3 positions shown · non-contrast
Comparison: None.

CLINICAL DATA: 30-year-old male with right fifth finger swelling
and redness. Denies injury. Initial encounter.

EXAM:
RIGHT LITTLE FINGER 2+V

[finger ap]
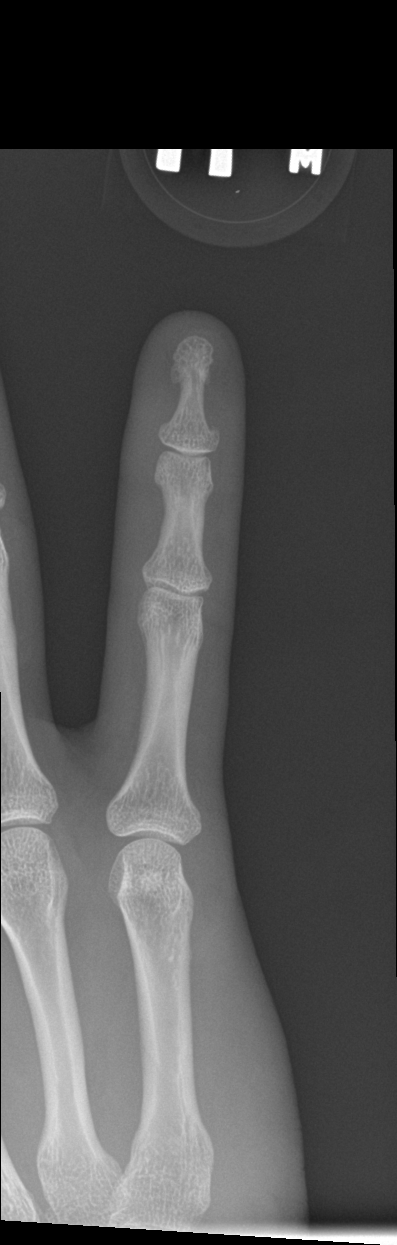

[finger obl]
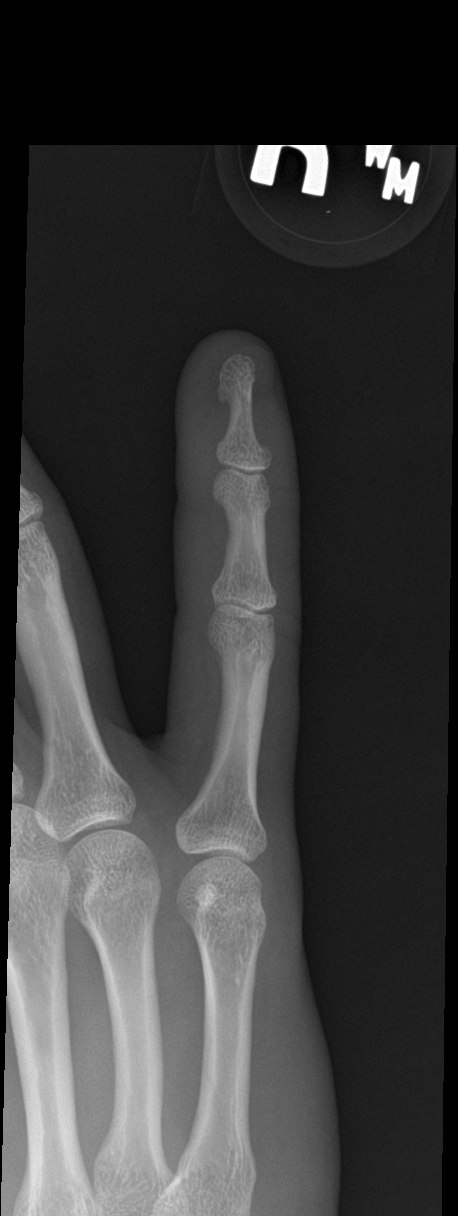

[finger lat]
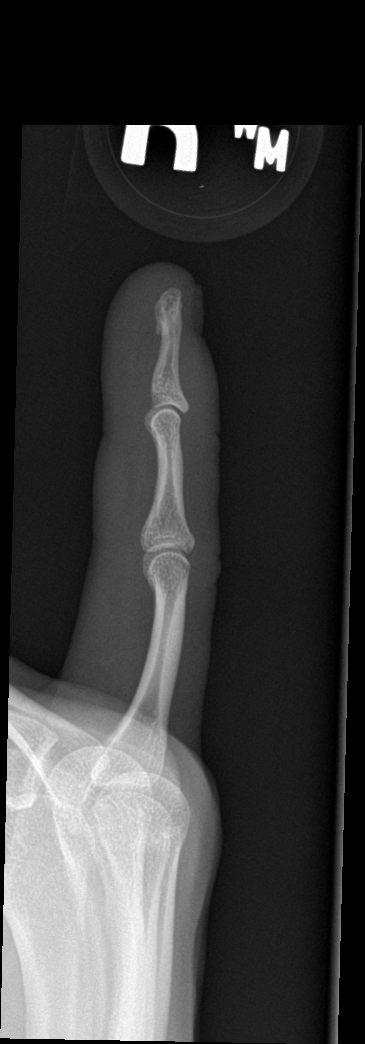

[3 of 3 positions shown; findings below may reference images not displayed]

FINDINGS: Soft tissue prominence may represent cellulitis in proper clinical
setting. No plain film evidence of osteomyelitis. No fracture or
dislocation.
IMPRESSION: Soft tissue prominence right fifth finger may be related to
cellulitis in the proper clinical setting. No osseous abnormality
noted.
